# Patient Record
Sex: Female | Born: 1970 | Race: White | Hispanic: No | Marital: Married | Smoking: Never smoker
Health system: Southern US, Community
[De-identification: ages and names within clinical notes are randomized; demographics above are authoritative.]

## PROBLEM LIST (undated history)

## (undated) DIAGNOSIS — E119 Type 2 diabetes mellitus without complications: Secondary | ICD-10-CM

## (undated) DIAGNOSIS — I10 Essential (primary) hypertension: Secondary | ICD-10-CM

## (undated) DIAGNOSIS — K649 Unspecified hemorrhoids: Secondary | ICD-10-CM

## (undated) DIAGNOSIS — J301 Allergic rhinitis due to pollen: Secondary | ICD-10-CM

## (undated) HISTORY — DX: Type 2 diabetes mellitus without complications: E11.9

## (undated) HISTORY — DX: Unspecified hemorrhoids: K64.9

## (undated) HISTORY — DX: Allergic rhinitis due to pollen: J30.1

## (undated) HISTORY — DX: Essential (primary) hypertension: I10

---

## 2011-01-26 ENCOUNTER — Encounter: Payer: Self-pay | Admitting: Internal Medicine

## 2011-01-26 ENCOUNTER — Ambulatory Visit (INDEPENDENT_AMBULATORY_CARE_PROVIDER_SITE_OTHER): Payer: PRIVATE HEALTH INSURANCE | Admitting: Internal Medicine

## 2011-01-26 DIAGNOSIS — Z79899 Other long term (current) drug therapy: Secondary | ICD-10-CM

## 2011-01-26 DIAGNOSIS — I10 Essential (primary) hypertension: Secondary | ICD-10-CM

## 2011-01-26 DIAGNOSIS — Z309 Encounter for contraceptive management, unspecified: Secondary | ICD-10-CM

## 2011-01-26 DIAGNOSIS — R5381 Other malaise: Secondary | ICD-10-CM

## 2011-01-26 DIAGNOSIS — R5383 Other fatigue: Secondary | ICD-10-CM

## 2011-01-26 DIAGNOSIS — E785 Hyperlipidemia, unspecified: Secondary | ICD-10-CM

## 2011-01-26 DIAGNOSIS — E669 Obesity, unspecified: Secondary | ICD-10-CM

## 2011-01-26 DIAGNOSIS — Z124 Encounter for screening for malignant neoplasm of cervix: Secondary | ICD-10-CM

## 2011-01-26 MED ORDER — LOSARTAN POTASSIUM-HCTZ 50-12.5 MG PO TABS: 1.0000 | ORAL_TABLET | Freq: Every day | ORAL | Status: DC

## 2011-01-26 MED ORDER — NORETHINDRONE 0.35 MG PO TABS: 1.0000 | ORAL_TABLET | Freq: Every day | ORAL | Status: DC

## 2011-01-27 ENCOUNTER — Encounter: Payer: Self-pay | Admitting: Internal Medicine

## 2011-01-27 DIAGNOSIS — Z124 Encounter for screening for malignant neoplasm of cervix: Secondary | ICD-10-CM | POA: Insufficient documentation

## 2011-01-27 DIAGNOSIS — Z309 Encounter for contraceptive management, unspecified: Secondary | ICD-10-CM | POA: Insufficient documentation

## 2011-01-27 DIAGNOSIS — I10 Essential (primary) hypertension: Secondary | ICD-10-CM | POA: Insufficient documentation

## 2011-01-27 DIAGNOSIS — E669 Obesity, unspecified: Secondary | ICD-10-CM | POA: Insufficient documentation

## 2011-01-27 DIAGNOSIS — K649 Unspecified hemorrhoids: Secondary | ICD-10-CM | POA: Insufficient documentation

## 2011-01-27 NOTE — Assessment & Plan Note (Addendum)
She did not take her medication today because she had ran out but will recheck her bp at the office after she has resumed medication for one week.

## 2011-01-27 NOTE — Assessment & Plan Note (Signed)
We spent 15 minutes discussing her current weight and ways to reduce it that may also prevent her development of diabetes and hypertriglyceridemia, which apparently have been borderline in the past.

## 2011-01-27 NOTE — Progress Notes (Signed)
  Subjective:    Patient ID: Kimberly Wilson, female    DOB: 1971-01-08, 40 y.o.   MRN: [id number]  HPI  Kimberly Wilson is a 40 yo white female with a history of hypertension who presents to establish care.  Her previous care was by [facility] in [place]) and [facility]  Past Medical History  Diagnosis Date  . Hypertension   . Allergic rhinitis due to pollen   . Hemorrhoids without complication     No current outpatient prescriptions on file prior to visit.     Review of Systems  Constitutional: Negative for fever, chills and unexpected weight change.  HENT: Negative for hearing loss, ear pain, nosebleeds, congestion, sore throat, facial swelling, rhinorrhea, sneezing, mouth sores, trouble swallowing, neck pain, neck stiffness, voice change, postnasal drip, sinus pressure, tinnitus and ear discharge.   Eyes: Negative for pain, discharge, redness and visual disturbance.  Respiratory: Negative for cough, chest tightness, shortness of breath, wheezing and stridor.   Cardiovascular: Negative for chest pain, palpitations and leg swelling.  Musculoskeletal: Negative for myalgias and arthralgias.  Skin: Negative for color change and rash.  Neurological: Negative for dizziness, weakness, light-headedness and headaches.  Hematological: Negative for adenopathy.   BP 126/82  Pulse 110  Temp(Src) 98.1 F (36.7 C) (Oral)  Resp 16  Ht 5\' 2"  (1.575 m)  Wt 211 lb (95.709 kg)  BMI 38.59 kg/m2  SpO2 99%  LMP 01/04/2011     Objective:   Physical Exam  Constitutional: She is oriented to person, place, and time. She appears well-developed and well-nourished.  HENT:  Mouth/Throat: Oropharynx is clear and moist.  Eyes: EOM are normal. Pupils are equal, round, and reactive to light. No scleral icterus.  Neck: Normal range of motion. Neck supple. No JVD present. No thyromegaly present.  Cardiovascular: Normal rate, regular rhythm, normal heart sounds and intact distal  pulses.   Pulmonary/Chest: Effort normal and breath sounds normal.  Abdominal: Soft. Bowel sounds are normal. She exhibits no mass. There is no tenderness.  Musculoskeletal: Normal range of motion. She exhibits no edema.  Lymphadenopathy:    She has no cervical adenopathy.  Neurological: She is alert and oriented to person, place, and time.  Skin: Skin is warm and dry.  Psychiatric: She has a normal mood and affect.          Assessment & Plan:

## 2012-01-04 ENCOUNTER — Other Ambulatory Visit: Payer: Self-pay | Admitting: *Deleted

## 2012-01-04 MED ORDER — NORETHINDRONE 0.35 MG PO TABS: 1.0000 | ORAL_TABLET | Freq: Every day | ORAL | Status: DC

## 2012-01-22 LAB — HM PAP SMEAR: HM Pap smear: NORMAL

## 2012-01-31 ENCOUNTER — Ambulatory Visit (INDEPENDENT_AMBULATORY_CARE_PROVIDER_SITE_OTHER): Payer: PRIVATE HEALTH INSURANCE | Admitting: Internal Medicine

## 2012-01-31 ENCOUNTER — Encounter: Payer: Self-pay | Admitting: Internal Medicine

## 2012-01-31 ENCOUNTER — Other Ambulatory Visit (HOSPITAL_COMMUNITY)
Admission: RE | Admit: 2012-01-31 | Discharge: 2012-01-31 | Disposition: A | Discharge status: Home / Self Care | Payer: PRIVATE HEALTH INSURANCE | Source: Ambulatory Visit | Attending: Internal Medicine | Admitting: Internal Medicine

## 2012-01-31 VITALS — BP 118/70 | HR 97 | Temp 98.7°F | Ht 62.5 in | Wt 226.8 lb

## 2012-01-31 DIAGNOSIS — Z1151 Encounter for screening for human papillomavirus (HPV): Secondary | ICD-10-CM | POA: Insufficient documentation

## 2012-01-31 DIAGNOSIS — Z01419 Encounter for gynecological examination (general) (routine) without abnormal findings: Secondary | ICD-10-CM | POA: Insufficient documentation

## 2012-01-31 DIAGNOSIS — I10 Essential (primary) hypertension: Secondary | ICD-10-CM

## 2012-01-31 MED ORDER — NORETHINDRONE 0.35 MG PO TABS: 1.0000 | ORAL_TABLET | Freq: Every day | ORAL | Status: DC

## 2012-01-31 MED ORDER — LOSARTAN POTASSIUM-HCTZ 50-12.5 MG PO TABS: 1.0000 | ORAL_TABLET | Freq: Every day | ORAL | Status: DC

## 2012-01-31 NOTE — Progress Notes (Signed)
Patient ID: Kimberly Wilson, female   DOB: [birth year], 41 y.o.   MRN: [id number] Subjective:     Kimberly Wilson is a 41 y.o. female here for a routine exam.  Current complaints: none.  Personal health questionnaire reviewed: yes.   Gynecologic History Patient's last menstrual period was 01/14/2012. Contraception: OCP (estrogen/progesterone) Last Pap: 2012. Results were: normal Last mammogram: 2012. Results were: normal  Obstetric History OB History    Grav Para Term Preterm Abortions TAB SAB Ect Mult Living                   The following portions of the patient's history were reviewed and updated as appropriate: allergies, current medications, past family history, past medical history, past social history, past surgical history and problem list.  Review of Systems A comprehensive review of systems was negative.    Objective:    BP 118/70  Pulse 97  Temp 98.7 F (37.1 C) (Oral)  Ht 5' 2.5" (1.588 m)  Wt 226 lb 12 oz (102.853 kg)  BMI 40.81 kg/m2  SpO2 97%  LMP 01/14/2012  General Appearance:    Alert, cooperative, no distress, appears stated age  Head:    Normocephalic, without obvious abnormality, atraumatic  Eyes:    PERRL, conjunctiva/corneas clear, EOM's intact, fundi    benign, both eyes  Ears:    Normal TM's and external ear canals, both ears  Nose:   Nares normal, septum midline, mucosa normal, no drainage    or sinus tenderness  Throat:   Lips, mucosa, and tongue normal; teeth and gums normal  Neck:   Supple, symmetrical, trachea midline, no adenopathy;    thyroid:  no enlargement/tenderness/nodules; no carotid   bruit or JVD  Back:     Symmetric, no curvature, ROM normal, no CVA tenderness  Lungs:     Clear to auscultation bilaterally, respirations unlabored  Chest Wall:    No tenderness or deformity   Heart:    Regular rate and rhythm, S1 and S2 normal, no murmur, rub   or gallop  Breast Exam:    No tenderness, masses, or nipple abnormality  Abdomen:      Soft, non-tender, bowel sounds active all four quadrants,    no masses, no organomegaly  Genitalia:    Normal female without lesion, discharge or tenderness  Rectal:    Normal tone, normal prostate, no masses or tenderness;   guaiac negative stool  Extremities:   Extremities normal, atraumatic, no cyanosis or edema  Pulses:   2+ and symmetric all extremities  Skin:   Skin color, texture, turgor normal, no rashes or lesions  Lymph nodes:   Cervical, supraclavicular, and axillary nodes normal  Neurologic:   CNII-XII intact, normal strength, sensation and reflexes    throughout      Assessment:    Healthy female exam.   Hypertension Well controlled on current regimen. Renal function is due, no changes today.   Updated Medication List Outpatient Encounter Prescriptions as of 01/31/2012  Medication Sig Dispense Refill  . losartan-hydrochlorothiazide (HYZAAR) 50-12.5 MG per tablet Take 1 tablet by mouth daily.  90 tablet  3  . norethindrone (ORTHO MICRONOR) 0.35 MG tablet Take 1 tablet (0.35 mg total) by mouth daily.  3 Package  3  . DISCONTD: losartan-hydrochlorothiazide (HYZAAR) 50-12.5 MG per tablet Take 1 tablet by mouth daily.  30 tablet  11  . DISCONTD: norethindrone (ORTHO MICRONOR) 0.35 MG tablet Take 1 tablet (0.35 mg total) by mouth daily.  1 Package  0      Plan:    Education reviewed: low fat, low cholesterol diet, safe sex/STD prevention and self breast exams. Contraception: OCP (estrogen/progesterone).

## 2012-02-03 ENCOUNTER — Encounter: Payer: Self-pay | Admitting: Internal Medicine

## 2012-02-03 NOTE — Assessment & Plan Note (Signed)
Well controlled on current regimen. Renal function is due, no changes today. °

## 2012-02-05 MED ORDER — FLUCONAZOLE 150 MG PO TABS: 150.0000 mg | ORAL_TABLET | Freq: Every day | ORAL | Status: DC

## 2012-02-05 NOTE — Addendum Note (Signed)
Addended by: Sherlene Shams on: 02/05/2012 07:04 AM   Modules accepted: Orders

## 2012-12-05 ENCOUNTER — Other Ambulatory Visit: Payer: Self-pay | Admitting: *Deleted

## 2012-12-05 NOTE — Telephone Encounter (Signed)
Patient was informed needed OV for further refills on last fill please advise as to refill,

## 2012-12-24 ENCOUNTER — Telehealth: Payer: Self-pay | Admitting: Internal Medicine

## 2012-12-24 MED ORDER — NORETHINDRONE 0.35 MG PO TABS: 1.0000 | ORAL_TABLET | Freq: Every day | ORAL | Status: DC

## 2012-12-24 MED ORDER — LOSARTAN POTASSIUM-HCTZ 50-12.5 MG PO TABS: 1.0000 | ORAL_TABLET | Freq: Every day | ORAL | Status: DC

## 2012-12-24 NOTE — Telephone Encounter (Signed)
Last OV 01/31/12. Ok to refill until January?

## 2012-12-24 NOTE — Telephone Encounter (Signed)
Let message, notifying pt refills were sent to pharmacy.

## 2012-12-24 NOTE — Telephone Encounter (Signed)
Pt asking if Dr. Darrick Huntsman could possibly extend her prescriptions refills for birth control and BP medication through January.  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  Pt advised she does not have to pay in full at time of service if she is unable, but pt states she she cannot.  Please advise if it is possible to extend refills through January.

## 2012-12-24 NOTE — Telephone Encounter (Signed)
Yes,  Ok to refill the requested meds through january

## 2013-04-08 ENCOUNTER — Telehealth: Payer: Self-pay | Admitting: Internal Medicine

## 2013-04-08 MED ORDER — NORETHINDRONE 0.35 MG PO TABS: 1.0000 | ORAL_TABLET | Freq: Every day | ORAL | Status: DC

## 2013-04-08 NOTE — Telephone Encounter (Signed)
cpe scheduled 05/05/2013.  States she will run out of birth control pills and needs an extension to get her through until cpe appt.  States she will be ok with BP med until cpe.

## 2013-04-08 NOTE — Telephone Encounter (Signed)
Rx sent to pharmacy by escript  

## 2013-04-24 ENCOUNTER — Other Ambulatory Visit: Payer: Self-pay | Admitting: *Deleted

## 2013-04-27 MED ORDER — NORETHINDRONE 0.35 MG PO TABS: 1.0000 | ORAL_TABLET | Freq: Every day | ORAL | Status: DC

## 2013-04-27 NOTE — Telephone Encounter (Signed)
Appt 02/23/14 

## 2013-05-05 ENCOUNTER — Encounter: Payer: PRIVATE HEALTH INSURANCE | Admitting: Internal Medicine

## 2013-05-20 ENCOUNTER — Encounter: Payer: Self-pay | Admitting: Internal Medicine

## 2013-05-20 ENCOUNTER — Ambulatory Visit (INDEPENDENT_AMBULATORY_CARE_PROVIDER_SITE_OTHER): Payer: No Typology Code available for payment source | Admitting: Internal Medicine

## 2013-05-20 VITALS — BP 138/100 | HR 112 | Temp 99.1°F | Ht 62.0 in | Wt 237.2 lb

## 2013-05-20 DIAGNOSIS — I10 Essential (primary) hypertension: Secondary | ICD-10-CM

## 2013-05-20 DIAGNOSIS — E669 Obesity, unspecified: Secondary | ICD-10-CM

## 2013-05-20 DIAGNOSIS — Z124 Encounter for screening for malignant neoplasm of cervix: Secondary | ICD-10-CM

## 2013-05-20 DIAGNOSIS — Z1239 Encounter for other screening for malignant neoplasm of breast: Secondary | ICD-10-CM

## 2013-05-20 DIAGNOSIS — Z0000 Encounter for general adult medical examination without abnormal findings: Secondary | ICD-10-CM

## 2013-05-20 MED ORDER — NORETHINDRONE 0.35 MG PO TABS: 1.0000 | ORAL_TABLET | Freq: Every day | ORAL | Status: DC

## 2013-05-20 MED ORDER — OMEPRAZOLE 40 MG PO CPDR: 40.0000 mg | DELAYED_RELEASE_CAPSULE | Freq: Every day | ORAL | Status: AC

## 2013-05-20 NOTE — Patient Instructions (Addendum)
Increasing omeprazole to 40 mg daily in AM  .  Can add 20 mg famotine  In the evening  Increase losartan to 2 tablets in am for persistent elevation  Of 140/90  Or higher   This is  One version of a  "Low GI"  Diet:  It will allow you to lose 4 to 8  lbs  per month if you follow it carefully.  Your goal with exercise is a minimum of 30 minutes of aerobic exercise 5 days per week (Walking does not count once it becomes easy!)    All of the foods can be found at grocery stores and in bulk at [employer].  The Atkins protein bars and shakes are available in more varieties at Target, WalMart and Lowe's Foods.     7 AM Breakfast:  Choose from the following:  Low carbohydrate Protein  Shakes (I recommend the EAS AdvantEdge "Carb Control" shakes  Or the low carb shakes by Atkins.    2.5 carbs   Arnold's "Sandwhich Thin"toasted  w/ peanut butter (no jelly: about 20 net carbs  "Bagel Thin" with cream cheese and salmon: about 20 carbs   a scrambled egg/bacon/cheese burrito made with Mission's "carb balance" whole wheat tortilla  (about 10 net carbs )   Avoid cereal and bananas, oatmeal and cream of wheat and grits. They are loaded with carbohydrates!   10 AM: high protein snack  Protein bar by Atkins (the snack size, under 200 cal, usually < 6 net carbs).    A stick of cheese:  Around 1 carb,  100 cal     Dannon Light n Fit [place] Yogurt  (80 cal, 8 carbs)  Other so called "protein bars" and Greek yogurts tend to be loaded with carbohydrates.  Remember, in food advertising, the word "energy" is synonymous for " carbohydrate."  Lunch:   A Sandwich using the bread choices listed, Can use any  Eggs,  lunchmeat, grilled meat or canned tuna), avocado, regular mayo/mustard  and cheese.  A Salad using blue cheese, ranch,  Goddess or vinagrette,  No croutons or "confetti" and no "candied nuts" but regular nuts OK.   No pretzels or chips.  Pickles and miniature sweet peppers are a good low carb alternative that  provide a "crunch"  The bread is the only source of carbohydrate in a sandwich and  can be decreased by trying some of these alternatives to traditional loaf bread  Joseph's makes a pita bread and a flat bread that are 50 cal and 4 net carbs available at BJs and WalMart.  This can be toasted to use with hummous as well  Toufayan makes a low carb flatbread that's 100 cal and 9 net carbs available at [employer] and [employer] makes 2 sizes of  Low carb whole wheat tortilla  (The large one is 210 cal and 6 net carbs) Avoid "Low fat dressings, as well as Reyne Dumas and [address] dressings They are loaded with sugar!   3 PM/ Mid day  Snack:  Consider  1 ounce of  almonds, walnuts, pistachios, pecans, peanuts,  Macadamia nuts or a nut medley.  Avoid "granola"; the dried cranberries and raisins are loaded with carbohydrates. Mixed nuts as long as there are no raisins,  cranberries or dried fruit.     6 PM  Dinner:     Meat/fowl/fish with a green salad, and either broccoli, cauliflower, green beans, spinach, brussel sprouts or  Lima beans. DO NOT  BREAD THE PROTEIN!!      There is a low carb pasta by Dreamfield's that is acceptable and tastes great: only 5 digestible carbs/serving.( All grocery stores but BJs carry it )  Try Kai LevinsMichel Angelo's chicken piccata or chicken or eggplant parm over low carb pasta.(Lowes and BJs)   Clifton CustardAaron Sanchez's "Carnitas" (pulled pork, no sauce,  0 carbs) or his beef pot roast to make a dinner burrito (at BJ's)  Pesto over low carb pasta (bj's sells a good quality pesto in the center refrigerated section of the deli   Whole wheat pasta is still full of digestible carbs and  Not as low in glycemic index as Dreamfield's.   Brown rice is still rice,  So skip the rice and noodles if you eat Congohinese or New Zealandhai (or at least limit to 1/2 cup)  9 PM snack :   Breyer's "low carb" fudgsicle or  ice cream bar (Carb Smart line), or  Weight Watcher's ice cream bar , or another "no  sugar added" ice cream;  a serving of fresh berries/cherries with whipped cream   Cheese or DANNON'S LlGHT N FIT [place] YOGURT  Avoid bananas, pineapple, grapes  and watermelon on a regular basis because they are high in sugar.  THINK OF THEM AS DESSERT  Remember that snack Substitutions should be less than 10 NET carbs per serving and meals < 20 carbs. Remember to subtract fiber grams to get the "net carbs."

## 2013-05-20 NOTE — Progress Notes (Signed)
Patient ID: Kimberly Wilson, female   DOB: [birth year], 43 y.o.   MRN: [id number]    Subjective:     Kimberly Wilson is a 43 y.o. female and is here for a comprehensive physical exam. The patient reports problems - as follows:Marland Kitchen  History   Social History  . Marital Status: Married    Spouse Name: N/A    Number of Children: N/A  . Years of Education: N/A   Occupational History  . Not on file.   Social History Main Topics  . Smoking status: Never Smoker   . Smokeless tobacco: Never Used  . Alcohol Use: Yes  . Drug Use: No  . Sexual Activity: Not on file   Other Topics Concern  . Not on file   Social History Narrative  . No narrative on file   Health Maintenance  Topic Date Due  . Influenza Vaccine  05/20/2014  . Pap Smear  01/31/2015  . Tetanus/tdap  05/20/2017    The following portions of the patient's history were reviewed and updated as appropriate: allergies, current medications, past family history, past medical history, past social history, past surgical history and problem list.  Review of Systems A comprehensive review of systems was negative.   Objective:   BP 138/100  Pulse 112  Temp(Src) 99.1 F (37.3 C) (Oral)  Ht 5\' 2"  (1.575 m)  Wt 237 lb 4 oz (107.616 kg)  BMI 43.38 kg/m2  SpO2 93%  LMP 04/29/2013  General Appearance:    Alert, cooperative, no distress, appears stated age  Head:    Normocephalic, without obvious abnormality, atraumatic  Eyes:    PERRL, conjunctiva/corneas clear, EOM's intact, fundi    benign, both eyes  Ears:    Normal TM's and external ear canals, both ears  Nose:   Nares normal, septum midline, mucosa normal, no drainage    or sinus tenderness  Throat:   Lips, mucosa, and tongue normal; teeth and gums normal  Neck:   Supple, symmetrical, trachea midline, no adenopathy;    thyroid:  no enlargement/tenderness/nodules; no carotid   bruit or JVD  Back:     Symmetric, no curvature, ROM normal, no CVA tenderness  Lungs:      Clear to auscultation bilaterally, respirations unlabored  Chest Wall:    No tenderness or deformity   Heart:    Regular rate and rhythm, S1 and S2 normal, no murmur, rub   or gallop  Breast Exam:    No tenderness, masses, or nipple abnormality  Abdomen:     Soft, non-tender, bowel sounds active all four quadrants,    no masses, no organomegaly  Extremities:   Extremities normal, atraumatic, no cyanosis or edema  Pulses:   2+ and symmetric all extremities  Skin:   Skin color, texture, turgor normal, no rashes or lesions  Lymph nodes:   Cervical, supraclavicular, and axillary nodes normal  Neurologic:   CNII-XII intact, normal strength, sensation and reflexes    throughout     Assessment and Plan:   Hypertension Elevated today.  Reviewed list of meds, patient is not taking OTC meds that could be causing,. It.  Have asked patient to recheck bp at home a minimum of 5 times over the next 4 weeks and if her readings are consistently greater than 140/80 she is to increase the losartan to 2 tablets daily and call for new prescription    Obesity (BMI 30-39.9) I have addressed  BMI and recommended a low glycemic index  diet utilizing smaller more frequent meals to increase metabolism. Long discussion about diet exercise and current schedule. I have also recommended that patient start exercising with a goal of 30 minutes of aerobic exercise a minimum of 5 days per week. Screening for lipid disorders, thyroid and diabetes to be done  This week .    Contraceptive management Refill today. Pap was normal in October 2013,  she will be due in 2016.  Screening for cervical cancer She has no history of abnormal Pap smears she had normal Pap smear in October 2013. She deferred pelvic  exam today we will repeat a Pap smear next year..   Screening for breast cancer She is due for mammogram but prefers to wait until May when the county has a participation in the prescreening offered by the hospital for  employees  Encounter for preventive health examination Annual comprehensive exam was done including breast, excluding pelvic exam. . All screenings have been addressed .   A total of 45 minutes was spent with patient more than half of which was spent in counseling, reviewing records from other prviders and coordination of care. Updated Medication List Outpatient Encounter Prescriptions as of 05/20/2013  Medication Sig  . losartan-hydrochlorothiazide (HYZAAR) 50-12.5 MG per tablet Take 1 tablet by mouth daily.  . norethindrone (ORTHO MICRONOR) 0.35 MG tablet Take 1 tablet (0.35 mg total) by mouth daily.  . [DISCONTINUED] norethindrone (ORTHO MICRONOR) 0.35 MG tablet Take 1 tablet (0.35 mg total) by mouth daily.  Marland Kitchen. omeprazole (PRILOSEC) 40 MG capsule Take 1 capsule (40 mg total) by mouth daily.  . [DISCONTINUED] fluconazole (DIFLUCAN) 150 MG tablet Take 1 tablet (150 mg total) by mouth daily.

## 2013-05-20 NOTE — Assessment & Plan Note (Addendum)
Elevated today.  Reviewed list of meds, patient is not taking OTC meds that could be causing,. It.  Have asked patient to recheck bp at home a minimum of 5 times over the next 4 weeks and if her readings are consistently greater than 140/80 she is to increase the losartan to 2 tablets daily and call for new prescription

## 2013-05-20 NOTE — Progress Notes (Signed)
Pre-visit discussion using our clinic review tool. No additional management support is needed unless otherwise documented below in the visit note.  

## 2013-05-21 DIAGNOSIS — Z1239 Encounter for other screening for malignant neoplasm of breast: Secondary | ICD-10-CM | POA: Insufficient documentation

## 2013-05-21 DIAGNOSIS — Z0000 Encounter for general adult medical examination without abnormal findings: Secondary | ICD-10-CM | POA: Insufficient documentation

## 2013-05-21 NOTE — Assessment & Plan Note (Signed)
I have addressed  BMI and recommended a low glycemic index diet utilizing smaller more frequent meals to increase metabolism. Long discussion about diet exercise and current schedule. I have also recommended that patient start exercising with a goal of 30 minutes of aerobic exercise a minimum of 5 days per week. Screening for lipid disorders, thyroid and diabetes to be done  This week .

## 2013-05-21 NOTE — Assessment & Plan Note (Signed)
Annual comprehensive exam was done including breast, excluding pelvicexam. All screenings have been addressed .  

## 2013-05-21 NOTE — Assessment & Plan Note (Signed)
She is due for mammogram but prefers to wait until May when the county has a participation in the prescreening offered by the hospital for employees

## 2013-05-21 NOTE — Assessment & Plan Note (Signed)
Refill today. Pap was normal in October 2013,  she will be due in 2016.

## 2013-05-21 NOTE — Assessment & Plan Note (Signed)
She has no history of abnormal Pap smears she had normal Pap smear in October 2013. She deferred pelvic  exam today we will repeat a Pap smear next year..Marland Kitchen

## 2013-05-25 ENCOUNTER — Telehealth: Payer: Self-pay | Admitting: Internal Medicine

## 2013-05-25 NOTE — Telephone Encounter (Signed)
Relevant patient education mailed to patient.  

## 2013-05-27 ENCOUNTER — Other Ambulatory Visit: Payer: Self-pay | Admitting: *Deleted

## 2013-05-27 NOTE — Telephone Encounter (Signed)
The patient is calling in wanting to get her lab results.Informed her Darrick Huntsmanullo was out and so was her nurse-sick. Please advise.

## 2013-05-28 MED ORDER — LOSARTAN POTASSIUM-HCTZ 50-12.5 MG PO TABS: 1.0000 | ORAL_TABLET | Freq: Every day | ORAL | Status: DC

## 2013-05-29 ENCOUNTER — Telehealth: Payer: Self-pay | Admitting: Internal Medicine

## 2013-05-29 NOTE — Telephone Encounter (Signed)
Patient called back notified patient you had not seen labs patient stated she will have county fax over on Monday. FYI

## 2013-05-29 NOTE — Telephone Encounter (Signed)
I havent' seen any labs results

## 2013-05-29 NOTE — Telephone Encounter (Signed)
The patient is returning your call she will call back at 12:00.

## 2013-05-29 NOTE — Telephone Encounter (Signed)
Patient calling for lab results stated for faxed over please advise.

## 2013-05-31 ENCOUNTER — Telehealth: Payer: Self-pay | Admitting: Internal Medicine

## 2013-05-31 DIAGNOSIS — R748 Abnormal levels of other serum enzymes: Secondary | ICD-10-CM | POA: Insufficient documentation

## 2013-05-31 DIAGNOSIS — E1169 Type 2 diabetes mellitus with other specified complication: Secondary | ICD-10-CM

## 2013-05-31 DIAGNOSIS — R7301 Impaired fasting glucose: Secondary | ICD-10-CM | POA: Insufficient documentation

## 2013-05-31 DIAGNOSIS — E669 Obesity, unspecified: Secondary | ICD-10-CM

## 2013-05-31 NOTE — Telephone Encounter (Signed)
Her labs were received from her office,  Her fasting glucose was 201,  Which is now  diagnostic of diabetes.  Her cholesterol was also high (triglycerides were  248 , normal is 150) .  She also had liver enzyme elevation which could be due to fatty liver or a variety of other conditions that irritate the liver .   She needs to make an OV to discuss the diagnosis,  In the meantime can she have a Hgba1c drawn to see if medications need to be started for diabetes or just dietary changes. Can she get that done without an order?

## 2013-05-31 NOTE — Assessment & Plan Note (Signed)
I have addressed  BMI and recommended a low glycemic index diet utilizing smaller more frequent meals to increase metabolism. Long discussion about diet exercise and current schedule. I have also recommended that patient start exercising with a goal of 30 minutes of aerobic exercise a minimum of 5 days per week. Screening for lipid disorders, thyroid and diabetes to be done  This week .

## 2013-06-01 NOTE — Telephone Encounter (Signed)
Patient is on mandatory overtime at this time and stated it will be very difficult for her to get the time off for an appointment please advise if I could maybe schedule her for a late night appointment patient gets off work at 5 pm.

## 2013-06-01 NOTE — Telephone Encounter (Signed)
Patient scheduled for 5.30 pm next Monday, for DM and lab follow up. Patient requested to please not add diagnosis to her chart until she can speak with you at appointment.

## 2013-06-01 NOTE — Telephone Encounter (Signed)
Yes you can schedule a 5:00 appt,  It will need to be 30 minutes for new diagnosis diabetes.  Can she get the A1c drawn this week so we have it back ?

## 2013-06-08 ENCOUNTER — Ambulatory Visit: Payer: No Typology Code available for payment source | Admitting: Internal Medicine

## 2013-11-05 ENCOUNTER — Other Ambulatory Visit: Payer: Self-pay | Admitting: Internal Medicine

## 2014-06-11 ENCOUNTER — Ambulatory Visit: Payer: Self-pay | Admitting: Nurse Practitioner

## 2015-03-14 ENCOUNTER — Ambulatory Visit: Payer: Self-pay | Admitting: Physician Assistant

## 2015-03-14 ENCOUNTER — Other Ambulatory Visit: Payer: Self-pay

## 2015-03-14 ENCOUNTER — Encounter: Payer: Self-pay | Admitting: Physician Assistant

## 2015-03-14 VITALS — BP 150/90 | HR 97 | Temp 99.1°F

## 2015-03-14 DIAGNOSIS — E785 Hyperlipidemia, unspecified: Secondary | ICD-10-CM

## 2015-03-14 DIAGNOSIS — E119 Type 2 diabetes mellitus without complications: Secondary | ICD-10-CM

## 2015-03-14 DIAGNOSIS — H6980 Other specified disorders of Eustachian tube, unspecified ear: Secondary | ICD-10-CM

## 2015-03-14 DIAGNOSIS — J069 Acute upper respiratory infection, unspecified: Secondary | ICD-10-CM

## 2015-03-14 DIAGNOSIS — J018 Other acute sinusitis: Secondary | ICD-10-CM

## 2015-03-14 DIAGNOSIS — R5382 Chronic fatigue, unspecified: Secondary | ICD-10-CM

## 2015-03-14 MED ORDER — PREDNISONE 10 MG PO TABS: 30.0000 mg | ORAL_TABLET | Freq: Every day | ORAL | Status: DC

## 2015-03-14 MED ORDER — FLUCONAZOLE 150 MG PO TABS: 150.0000 mg | ORAL_TABLET | Freq: Once | ORAL | Status: DC

## 2015-03-14 MED ORDER — AZITHROMYCIN 250 MG PO TABS: ORAL_TABLET | ORAL | Status: DC

## 2015-03-14 NOTE — Progress Notes (Signed)
Patient came in to have blood drawn per Kimberly Wilson, ANP-C at [facility].  Blood was drawn from left hand arm without any incident.

## 2015-03-14 NOTE — Progress Notes (Signed)
S:  C/o ears popping and being stopped up, no drainage from ears, no fever/chills, no cough or congestion, some sinus pressure, some wheezing, no cp/sob;  remainder ros neg Using otc meds without relief  O:  Vitals wnl, nad, tms dull b/l, nasal mucosa swollen, throat wnl, neck supple no lymph, lungs c t a, cv rrr, neuro intact  A: acute eustachean tube dysfunction, acute uri  P: flonase, zpack, diflucan if needed;  prednisone 30mg  qd x 3d, return if not improving in 3 to 5 days, return earlier if worsening

## 2015-03-15 LAB — CMP12+LP+TP+TSH+6AC+CBC/D/PLT
A/G RATIO: 1.5 (ref 1.1–2.5)
ALBUMIN: 3.8 g/dL (ref 3.5–5.5)
ALK PHOS: 99 IU/L (ref 39–117)
ALT: 16 IU/L (ref 0–32)
AST: 18 IU/L (ref 0–40)
BASOS ABS: 0 10*3/uL (ref 0.0–0.2)
BASOS: 0 %
BUN / CREAT RATIO: 16 (ref 9–23)
BUN: 9 mg/dL (ref 6–24)
CHLORIDE: 100 mmol/L (ref 97–106)
CHOLESTEROL TOTAL: 169 mg/dL (ref 100–199)
Calcium: 8.5 mg/dL — ABNORMAL LOW (ref 8.7–10.2)
Chol/HDL Ratio: 2.7 ratio units (ref 0.0–4.4)
Creatinine, Ser: 0.57 mg/dL (ref 0.57–1.00)
EOS (ABSOLUTE): 0.1 10*3/uL (ref 0.0–0.4)
EOS: 1 %
FREE THYROXINE INDEX: 1.9 (ref 1.2–4.9)
GFR calc non Af Amer: 112 mL/min/{1.73_m2} (ref >59)
GFR, EST AFRICAN AMERICAN: 129 mL/min/{1.73_m2} (ref >59)
GGT: 43 IU/L (ref 0–60)
GLUCOSE: 164 mg/dL — AB (ref 65–99)
Globulin, Total: 2.6 g/dL (ref 1.5–4.5)
HDL: 62 mg/dL (ref >39)
HEMATOCRIT: 38.9 % (ref 34.0–46.6)
HEMOGLOBIN: 13 g/dL (ref 11.1–15.9)
IMMATURE GRANS (ABS): 0 10*3/uL (ref 0.0–0.1)
IMMATURE GRANULOCYTES: 0 %
IRON: 49 ug/dL (ref 27–159)
LDH: 163 IU/L (ref 119–226)
LDL CALC: 91 mg/dL (ref 0–99)
LYMPHS ABS: 2 10*3/uL (ref 0.7–3.1)
LYMPHS: 28 %
MCH: 29 pg (ref 26.6–33.0)
MCHC: 33.4 g/dL (ref 31.5–35.7)
MCV: 87 fL (ref 79–97)
MONOCYTES: 6 %
Monocytes Absolute: 0.4 10*3/uL (ref 0.1–0.9)
NEUTROS PCT: 65 %
Neutrophils Absolute: 4.6 10*3/uL (ref 1.4–7.0)
PLATELETS: 272 10*3/uL (ref 150–379)
Phosphorus: 3.2 mg/dL (ref 2.5–4.5)
Potassium: 4 mmol/L (ref 3.5–5.2)
RBC: 4.49 x10E6/uL (ref 3.77–5.28)
RDW: 13.6 % (ref 12.3–15.4)
SODIUM: 138 mmol/L (ref 136–144)
T3 UPTAKE RATIO: 26 % (ref 24–39)
T4, Total: 7.4 ug/dL (ref 4.5–12.0)
TOTAL PROTEIN: 6.4 g/dL (ref 6.0–8.5)
TSH: 1.94 u[IU]/mL (ref 0.450–4.500)
Triglycerides: 79 mg/dL (ref 0–149)
URIC ACID: 2.3 mg/dL — AB (ref 2.5–7.1)
VLDL CHOLESTEROL CAL: 16 mg/dL (ref 5–40)
WBC: 7.1 10*3/uL (ref 3.4–10.8)

## 2015-03-15 LAB — MICROALBUMIN / CREATININE URINE RATIO
Creatinine, Urine: 138.6 mg/dL
MICROALB/CREAT RATIO: 63.1 mg/g{creat} — AB (ref 0.0–30.0)
MICROALBUM., U, RANDOM: 87.5 ug/mL

## 2015-03-23 ENCOUNTER — Other Ambulatory Visit: Payer: Self-pay

## 2015-03-23 DIAGNOSIS — E119 Type 2 diabetes mellitus without complications: Secondary | ICD-10-CM

## 2015-03-23 NOTE — Progress Notes (Signed)
Patient came in to have blood drawn per Imelda Pillowhelsa Holland, ANP-C at [facility].  Blood was drawn from left hand without any incident.

## 2015-03-24 LAB — HGB A1C W/O EAG: Hgb A1c MFr Bld: 7.8 % — ABNORMAL HIGH (ref 4.8–5.6)

## 2015-03-24 NOTE — Progress Notes (Signed)
Lab results were faxed to Imelda Pillowhelsa Holland at [facility] per patient's request.

## 2015-03-29 ENCOUNTER — Ambulatory Visit: Payer: Self-pay | Admitting: Physician Assistant

## 2015-08-03 ENCOUNTER — Other Ambulatory Visit: Payer: Self-pay

## 2015-08-03 DIAGNOSIS — Z299 Encounter for prophylactic measures, unspecified: Secondary | ICD-10-CM

## 2015-08-03 NOTE — Progress Notes (Signed)
Patient came in to have blood drawn per Rockney Gheehelsea Holland, ANP at [facility] orders.  Blood was drawn from the right hand without any incident. Patient [facility] and she (patient) wants a copy mailed to her home address.

## 2015-08-04 LAB — CMP12+LP+TP+TSH+6AC+CBC/D/PLT
A/G RATIO: 1.6 (ref 1.2–2.2)
ALBUMIN: 3.9 g/dL (ref 3.5–5.5)
ALT: 32 IU/L (ref 0–32)
AST: 20 IU/L (ref 0–40)
Alkaline Phosphatase: 101 IU/L (ref 39–117)
BASOS ABS: 0 10*3/uL (ref 0.0–0.2)
BUN/Creatinine Ratio: 12 (ref 9–23)
BUN: 8 mg/dL (ref 6–24)
Basos: 0 %
Bilirubin Total: 0.5 mg/dL (ref 0.0–1.2)
CALCIUM: 8.9 mg/dL (ref 8.7–10.2)
CHOLESTEROL TOTAL: 168 mg/dL (ref 100–199)
Chloride: 97 mmol/L (ref 96–106)
Chol/HDL Ratio: 3.3 ratio units (ref 0.0–4.4)
Creatinine, Ser: 0.66 mg/dL (ref 0.57–1.00)
EOS (ABSOLUTE): 0.1 10*3/uL (ref 0.0–0.4)
Eos: 1 %
Estimated CHD Risk: <0.5 times avg. (ref 0.0–1.0)
FREE THYROXINE INDEX: 1.6 (ref 1.2–4.9)
GFR calc non Af Amer: 107 mL/min/{1.73_m2} (ref >59)
GFR, EST AFRICAN AMERICAN: 123 mL/min/{1.73_m2} (ref >59)
GGT: 72 IU/L — AB (ref 0–60)
GLOBULIN, TOTAL: 2.4 g/dL (ref 1.5–4.5)
Glucose: 229 mg/dL — ABNORMAL HIGH (ref 65–99)
HDL: 51 mg/dL (ref >39)
Hematocrit: 39.8 % (ref 34.0–46.6)
Hemoglobin: 13.6 g/dL (ref 11.1–15.9)
IMMATURE GRANS (ABS): 0 10*3/uL (ref 0.0–0.1)
IRON: 106 ug/dL (ref 27–159)
Immature Granulocytes: 0 %
LDH: 169 IU/L (ref 119–226)
LDL Calculated: 85 mg/dL (ref 0–99)
LYMPHS: 29 %
Lymphocytes Absolute: 1.9 10*3/uL (ref 0.7–3.1)
MCH: 29.6 pg (ref 26.6–33.0)
MCHC: 34.2 g/dL (ref 31.5–35.7)
MCV: 87 fL (ref 79–97)
MONOCYTES: 5 %
MONOS ABS: 0.4 10*3/uL (ref 0.1–0.9)
NEUTROS ABS: 4.3 10*3/uL (ref 1.4–7.0)
Neutrophils: 65 %
PHOSPHORUS: 3.6 mg/dL (ref 2.5–4.5)
PLATELETS: 231 10*3/uL (ref 150–379)
POTASSIUM: 4.2 mmol/L (ref 3.5–5.2)
RBC: 4.6 x10E6/uL (ref 3.77–5.28)
RDW: 13.7 % (ref 12.3–15.4)
Sodium: 138 mmol/L (ref 134–144)
T3 UPTAKE RATIO: 27 % (ref 24–39)
T4, Total: 6 ug/dL (ref 4.5–12.0)
TOTAL PROTEIN: 6.3 g/dL (ref 6.0–8.5)
TRIGLYCERIDES: 158 mg/dL — AB (ref 0–149)
TSH: 2.75 u[IU]/mL (ref 0.450–4.500)
Uric Acid: 2.5 mg/dL (ref 2.5–7.1)
VLDL Cholesterol Cal: 32 mg/dL (ref 5–40)
WBC: 6.7 10*3/uL (ref 3.4–10.8)

## 2015-08-04 LAB — HGB A1C W/O EAG: HEMOGLOBIN A1C: 9.7 % — AB (ref 4.8–5.6)

## 2015-11-28 ENCOUNTER — Other Ambulatory Visit: Payer: Self-pay

## 2015-11-28 DIAGNOSIS — Z299 Encounter for prophylactic measures, unspecified: Secondary | ICD-10-CM

## 2015-11-28 NOTE — Progress Notes (Signed)
Patient came in to have blood drawn for testing per Imelda Pillowhelsa Holland from [facility] authorization.

## 2015-11-29 LAB — CMP12+LP+TP+TSH+6AC+CBC/D/PLT
ALBUMIN: 4.1 g/dL (ref 3.5–5.5)
ALK PHOS: 98 IU/L (ref 39–117)
ALT: 22 IU/L (ref 0–32)
AST: 16 IU/L (ref 0–40)
Albumin/Globulin Ratio: 1.8 (ref 1.2–2.2)
BASOS: 0 %
BUN/Creatinine Ratio: 13 (ref 9–23)
BUN: 7 mg/dL (ref 6–24)
Basophils Absolute: 0 10*3/uL (ref 0.0–0.2)
Bilirubin Total: 0.3 mg/dL (ref 0.0–1.2)
CALCIUM: 8.6 mg/dL — AB (ref 8.7–10.2)
CHLORIDE: 99 mmol/L (ref 96–106)
CHOL/HDL RATIO: 3.2 ratio (ref 0.0–4.4)
CHOLESTEROL TOTAL: 161 mg/dL (ref 100–199)
Creatinine, Ser: 0.53 mg/dL — ABNORMAL LOW (ref 0.57–1.00)
EOS (ABSOLUTE): 0.1 10*3/uL (ref 0.0–0.4)
Eos: 1 %
FREE THYROXINE INDEX: 1.4 (ref 1.2–4.9)
GFR calc Af Amer: 133 mL/min/{1.73_m2} (ref >59)
GFR calc non Af Amer: 115 mL/min/{1.73_m2} (ref >59)
GGT: 88 IU/L — AB (ref 0–60)
GLOBULIN, TOTAL: 2.3 g/dL (ref 1.5–4.5)
Glucose: 179 mg/dL — ABNORMAL HIGH (ref 65–99)
HDL: 50 mg/dL (ref >39)
HEMATOCRIT: 39 % (ref 34.0–46.6)
Hemoglobin: 12.8 g/dL (ref 11.1–15.9)
Immature Grans (Abs): 0 10*3/uL (ref 0.0–0.1)
Immature Granulocytes: 0 %
Iron: 65 ug/dL (ref 27–159)
LDH: 134 IU/L (ref 119–226)
LDL CALC: 72 mg/dL (ref 0–99)
LYMPHS ABS: 2.3 10*3/uL (ref 0.7–3.1)
Lymphs: 30 %
MCH: 29 pg (ref 26.6–33.0)
MCHC: 32.8 g/dL (ref 31.5–35.7)
MCV: 88 fL (ref 79–97)
MONOS ABS: 0.4 10*3/uL (ref 0.1–0.9)
Monocytes: 5 %
NEUTROS ABS: 4.7 10*3/uL (ref 1.4–7.0)
Neutrophils: 64 %
PHOSPHORUS: 3.2 mg/dL (ref 2.5–4.5)
POTASSIUM: 4 mmol/L (ref 3.5–5.2)
Platelets: 246 10*3/uL (ref 150–379)
RBC: 4.41 x10E6/uL (ref 3.77–5.28)
RDW: 13.6 % (ref 12.3–15.4)
SODIUM: 139 mmol/L (ref 134–144)
T3 Uptake Ratio: 24 % (ref 24–39)
T4 TOTAL: 5.7 ug/dL (ref 4.5–12.0)
TSH: 1.74 u[IU]/mL (ref 0.450–4.500)
Total Protein: 6.4 g/dL (ref 6.0–8.5)
Triglycerides: 193 mg/dL — ABNORMAL HIGH (ref 0–149)
Uric Acid: 2.6 mg/dL (ref 2.5–7.1)
VLDL Cholesterol Cal: 39 mg/dL (ref 5–40)
WBC: 7.5 10*3/uL (ref 3.4–10.8)

## 2015-11-29 LAB — MICROALBUMIN / CREATININE URINE RATIO
Creatinine, Urine: 156.1 mg/dL
MICROALB/CREAT RATIO: 69.2 mg/g creat — ABNORMAL HIGH (ref 0.0–30.0)
MICROALBUM., U, RANDOM: 108 ug/mL

## 2015-11-29 LAB — HGB A1C W/O EAG: Hgb A1c MFr Bld: 8.8 % — ABNORMAL HIGH (ref 4.8–5.6)

## 2016-07-27 ENCOUNTER — Other Ambulatory Visit: Payer: Self-pay

## 2016-07-27 DIAGNOSIS — Z299 Encounter for prophylactic measures, unspecified: Secondary | ICD-10-CM

## 2016-07-27 NOTE — Progress Notes (Signed)
Patient came in to have blood drawn for testing per Orson Eva, ANP orders. Patient wants results faxed to her provider's office and a copy mailed the her home address when they are finalized.

## 2016-07-28 LAB — CMP12+LP+TP+TSH+6AC+CBC/D/PLT
A/G RATIO: 1.6 (ref 1.2–2.2)
ALT: 33 IU/L — AB (ref 0–32)
AST: 24 IU/L (ref 0–40)
Albumin: 4.2 g/dL (ref 3.5–5.5)
Alkaline Phosphatase: 118 IU/L — ABNORMAL HIGH (ref 39–117)
BASOS ABS: 0 10*3/uL (ref 0.0–0.2)
BUN / CREAT RATIO: 26 — AB (ref 9–23)
BUN: 16 mg/dL (ref 6–24)
Basos: 0 %
Bilirubin Total: 0.3 mg/dL (ref 0.0–1.2)
CALCIUM: 9.5 mg/dL (ref 8.7–10.2)
CHOL/HDL RATIO: 3.1 ratio (ref 0.0–4.4)
CREATININE: 0.61 mg/dL (ref 0.57–1.00)
Chloride: 96 mmol/L (ref 96–106)
Cholesterol, Total: 174 mg/dL (ref 100–199)
EOS (ABSOLUTE): 0.1 10*3/uL (ref 0.0–0.4)
Eos: 1 %
Estimated CHD Risk: <0.5 times avg. (ref 0.0–1.0)
Free Thyroxine Index: 1.6 (ref 1.2–4.9)
GFR, EST AFRICAN AMERICAN: 126 mL/min/{1.73_m2} (ref >59)
GFR, EST NON AFRICAN AMERICAN: 109 mL/min/{1.73_m2} (ref >59)
GGT: 125 IU/L — AB (ref 0–60)
GLOBULIN, TOTAL: 2.6 g/dL (ref 1.5–4.5)
Glucose: 237 mg/dL — ABNORMAL HIGH (ref 65–99)
HDL: 56 mg/dL (ref >39)
Hematocrit: 42.1 % (ref 34.0–46.6)
Hemoglobin: 14.2 g/dL (ref 11.1–15.9)
IMMATURE GRANS (ABS): 0 10*3/uL (ref 0.0–0.1)
IRON: 68 ug/dL (ref 27–159)
Immature Granulocytes: 0 %
LDH: 179 IU/L (ref 119–226)
LDL Calculated: 66 mg/dL (ref 0–99)
LYMPHS ABS: 2.7 10*3/uL (ref 0.7–3.1)
Lymphs: 31 %
MCH: 29.2 pg (ref 26.6–33.0)
MCHC: 33.7 g/dL (ref 31.5–35.7)
MCV: 86 fL (ref 79–97)
MONOS ABS: 0.6 10*3/uL (ref 0.1–0.9)
Monocytes: 6 %
NEUTROS ABS: 5.5 10*3/uL (ref 1.4–7.0)
Neutrophils: 62 %
PHOSPHORUS: 3.5 mg/dL (ref 2.5–4.5)
PLATELETS: 265 10*3/uL (ref 150–379)
POTASSIUM: 4.6 mmol/L (ref 3.5–5.2)
RBC: 4.87 x10E6/uL (ref 3.77–5.28)
RDW: 13.4 % (ref 12.3–15.4)
Sodium: 140 mmol/L (ref 134–144)
T3 UPTAKE RATIO: 23 % — AB (ref 24–39)
T4 TOTAL: 6.9 ug/dL (ref 4.5–12.0)
TOTAL PROTEIN: 6.8 g/dL (ref 6.0–8.5)
TSH: 2.78 u[IU]/mL (ref 0.450–4.500)
Triglycerides: 258 mg/dL — ABNORMAL HIGH (ref 0–149)
URIC ACID: 2.6 mg/dL (ref 2.5–7.1)
VLDL Cholesterol Cal: 52 mg/dL — ABNORMAL HIGH (ref 5–40)
WBC: 8.9 10*3/uL (ref 3.4–10.8)

## 2016-07-28 LAB — HGB A1C W/O EAG: Hgb A1c MFr Bld: 8.4 % — ABNORMAL HIGH (ref 4.8–5.6)

## 2016-08-16 ENCOUNTER — Encounter: Payer: Self-pay | Admitting: Physician Assistant

## 2016-08-16 ENCOUNTER — Ambulatory Visit: Payer: Self-pay | Admitting: Physician Assistant

## 2016-08-16 VITALS — BP 123/80 | HR 89 | Temp 98.2°F | Ht 62.0 in | Wt 260.0 lb

## 2016-08-16 DIAGNOSIS — Z0189 Encounter for other specified special examinations: Secondary | ICD-10-CM

## 2016-08-16 DIAGNOSIS — Z008 Encounter for other general examination: Secondary | ICD-10-CM

## 2016-08-16 DIAGNOSIS — Z0000 Encounter for general adult medical examination without abnormal findings: Secondary | ICD-10-CM

## 2016-08-16 NOTE — Progress Notes (Signed)
S: pt here for wellness physical and biometrics for insurance purposes, no complaints ros neg. PMH:   DM, HTN, has a pcp that follows her Social:  Nonsmoker, no etoh, no drugs Fam: pt states she is adopted  O: vitals wnl, nad, ENT wnl, neck supple no lymph, lungs c t a, cv rrr, abd soft nontender bs normal all 4 quads  A: wellness, biometric physical  P:  f/u with pcp

## 2016-11-23 ENCOUNTER — Other Ambulatory Visit: Payer: Self-pay

## 2016-11-23 DIAGNOSIS — Z299 Encounter for prophylactic measures, unspecified: Secondary | ICD-10-CM

## 2016-11-23 NOTE — Progress Notes (Signed)
Patient came in to have her blood drawn for testing per Orson Evahelsa Boswell, NP orders.

## 2016-11-24 LAB — CMP12+LP+TP+TSH+6AC+CBC/D/PLT
A/G RATIO: 1.7 (ref 1.2–2.2)
ALK PHOS: 115 IU/L (ref 39–117)
ALT: 24 IU/L (ref 0–32)
AST: 18 IU/L (ref 0–40)
Albumin: 4.2 g/dL (ref 3.5–5.5)
BASOS ABS: 0 10*3/uL (ref 0.0–0.2)
BASOS: 0 %
BUN / CREAT RATIO: 14 (ref 9–23)
BUN: 9 mg/dL (ref 6–24)
Bilirubin Total: 0.3 mg/dL (ref 0.0–1.2)
CHOL/HDL RATIO: 2.8 ratio (ref 0.0–4.4)
CREATININE: 0.63 mg/dL (ref 0.57–1.00)
Calcium: 9.3 mg/dL (ref 8.7–10.2)
Chloride: 101 mmol/L (ref 96–106)
Cholesterol, Total: 174 mg/dL (ref 100–199)
EOS (ABSOLUTE): 0.1 10*3/uL (ref 0.0–0.4)
EOS: 1 %
Free Thyroxine Index: 1.5 (ref 1.2–4.9)
GFR, EST AFRICAN AMERICAN: 124 mL/min/{1.73_m2} (ref >59)
GFR, EST NON AFRICAN AMERICAN: 108 mL/min/{1.73_m2} (ref >59)
GGT: 102 IU/L — ABNORMAL HIGH (ref 0–60)
Globulin, Total: 2.5 g/dL (ref 1.5–4.5)
Glucose: 137 mg/dL — ABNORMAL HIGH (ref 65–99)
HDL: 62 mg/dL (ref >39)
HEMATOCRIT: 40.6 % (ref 34.0–46.6)
HEMOGLOBIN: 13.4 g/dL (ref 11.1–15.9)
IMMATURE GRANULOCYTES: 0 %
Immature Grans (Abs): 0 10*3/uL (ref 0.0–0.1)
Iron: 67 ug/dL (ref 27–159)
LDH: 167 IU/L (ref 119–226)
LDL CALC: 83 mg/dL (ref 0–99)
LYMPHS ABS: 2.5 10*3/uL (ref 0.7–3.1)
Lymphs: 29 %
MCH: 29.3 pg (ref 26.6–33.0)
MCHC: 33 g/dL (ref 31.5–35.7)
MCV: 89 fL (ref 79–97)
MONOCYTES: 5 %
Monocytes Absolute: 0.4 10*3/uL (ref 0.1–0.9)
Neutrophils Absolute: 5.4 10*3/uL (ref 1.4–7.0)
Neutrophils: 65 %
PLATELETS: 240 10*3/uL (ref 150–379)
Phosphorus: 3.6 mg/dL (ref 2.5–4.5)
Potassium: 4.6 mmol/L (ref 3.5–5.2)
RBC: 4.57 x10E6/uL (ref 3.77–5.28)
RDW: 13.3 % (ref 12.3–15.4)
Sodium: 146 mmol/L — ABNORMAL HIGH (ref 134–144)
T3 Uptake Ratio: 23 % — ABNORMAL LOW (ref 24–39)
T4, Total: 6.4 ug/dL (ref 4.5–12.0)
TSH: 2.85 u[IU]/mL (ref 0.450–4.500)
Total Protein: 6.7 g/dL (ref 6.0–8.5)
Triglycerides: 143 mg/dL (ref 0–149)
Uric Acid: 2.8 mg/dL (ref 2.5–7.1)
VLDL CHOLESTEROL CAL: 29 mg/dL (ref 5–40)
WBC: 8.4 10*3/uL (ref 3.4–10.8)

## 2016-11-24 LAB — HGB A1C W/O EAG: HEMOGLOBIN A1C: 7.7 % — AB (ref 4.8–5.6)

## 2016-11-24 LAB — MICROALBUMIN / CREATININE URINE RATIO
CREATININE, UR: 69.1 mg/dL
MICROALBUM., U, RANDOM: 12 ug/mL
Microalb/Creat Ratio: 17.4 mg/g creat (ref 0.0–30.0)

## 2017-03-29 ENCOUNTER — Other Ambulatory Visit: Payer: Self-pay

## 2017-03-29 DIAGNOSIS — Z299 Encounter for prophylactic measures, unspecified: Secondary | ICD-10-CM

## 2017-03-29 NOTE — Progress Notes (Signed)
Patient came in to have blood drawn [facility].

## 2017-03-30 LAB — CMP12+LP+TP+TSH+6AC+CBC/D/PLT
ALK PHOS: 111 IU/L (ref 39–117)
ALT: 33 IU/L — AB (ref 0–32)
AST: 20 IU/L (ref 0–40)
Albumin/Globulin Ratio: 1.6 (ref 1.2–2.2)
Albumin: 3.8 g/dL (ref 3.5–5.5)
BASOS: 0 %
BUN / CREAT RATIO: 14 (ref 9–23)
BUN: 9 mg/dL (ref 6–24)
Basophils Absolute: 0 10*3/uL (ref 0.0–0.2)
Bilirubin Total: 0.3 mg/dL (ref 0.0–1.2)
CALCIUM: 9.4 mg/dL (ref 8.7–10.2)
CHLORIDE: 101 mmol/L (ref 96–106)
CHOL/HDL RATIO: 2.9 ratio (ref 0.0–4.4)
Cholesterol, Total: 158 mg/dL (ref 100–199)
Creatinine, Ser: 0.64 mg/dL (ref 0.57–1.00)
EOS (ABSOLUTE): 0.1 10*3/uL (ref 0.0–0.4)
EOS: 1 %
Estimated CHD Risk: <0.5 times avg. (ref 0.0–1.0)
Free Thyroxine Index: 1.5 (ref 1.2–4.9)
GFR calc non Af Amer: 107 mL/min/{1.73_m2} (ref >59)
GFR, EST AFRICAN AMERICAN: 123 mL/min/{1.73_m2} (ref >59)
GGT: 156 IU/L — ABNORMAL HIGH (ref 0–60)
GLOBULIN, TOTAL: 2.4 g/dL (ref 1.5–4.5)
Glucose: 228 mg/dL — ABNORMAL HIGH (ref 65–99)
HDL: 55 mg/dL (ref >39)
HEMATOCRIT: 39.3 % (ref 34.0–46.6)
HEMOGLOBIN: 13.3 g/dL (ref 11.1–15.9)
IMMATURE GRANS (ABS): 0 10*3/uL (ref 0.0–0.1)
IMMATURE GRANULOCYTES: 0 %
Iron: 75 ug/dL (ref 27–159)
LDH: 153 IU/L (ref 119–226)
LDL CALC: 75 mg/dL (ref 0–99)
LYMPHS: 27 %
Lymphocytes Absolute: 2.3 10*3/uL (ref 0.7–3.1)
MCH: 29.6 pg (ref 26.6–33.0)
MCHC: 33.8 g/dL (ref 31.5–35.7)
MCV: 88 fL (ref 79–97)
MONOS ABS: 0.5 10*3/uL (ref 0.1–0.9)
Monocytes: 6 %
NEUTROS PCT: 66 %
Neutrophils Absolute: 5.5 10*3/uL (ref 1.4–7.0)
POTASSIUM: 4.4 mmol/L (ref 3.5–5.2)
Phosphorus: 4.6 mg/dL — ABNORMAL HIGH (ref 2.5–4.5)
Platelets: 228 10*3/uL (ref 150–379)
RBC: 4.49 x10E6/uL (ref 3.77–5.28)
RDW: 13.7 % (ref 12.3–15.4)
SODIUM: 141 mmol/L (ref 134–144)
T3 Uptake Ratio: 25 % (ref 24–39)
T4, Total: 5.9 ug/dL (ref 4.5–12.0)
TRIGLYCERIDES: 138 mg/dL (ref 0–149)
TSH: 2.37 u[IU]/mL (ref 0.450–4.500)
Total Protein: 6.2 g/dL (ref 6.0–8.5)
Uric Acid: 2.9 mg/dL (ref 2.5–7.1)
VLDL Cholesterol Cal: 28 mg/dL (ref 5–40)
WBC: 8.4 10*3/uL (ref 3.4–10.8)

## 2017-03-30 LAB — HGB A1C W/O EAG: HEMOGLOBIN A1C: 9.2 % — AB (ref 4.8–5.6)

## 2017-03-30 LAB — VITAMIN D 25 HYDROXY (VIT D DEFICIENCY, FRACTURES): VIT D 25 HYDROXY: 23 ng/mL — AB (ref 30.0–100.0)

## 2017-04-01 ENCOUNTER — Other Ambulatory Visit: Payer: Self-pay

## 2017-08-30 ENCOUNTER — Other Ambulatory Visit: Payer: Self-pay

## 2017-08-30 DIAGNOSIS — E559 Vitamin D deficiency, unspecified: Secondary | ICD-10-CM

## 2017-08-30 DIAGNOSIS — E785 Hyperlipidemia, unspecified: Secondary | ICD-10-CM

## 2017-08-30 DIAGNOSIS — E118 Type 2 diabetes mellitus with unspecified complications: Secondary | ICD-10-CM

## 2017-08-30 DIAGNOSIS — I10 Essential (primary) hypertension: Secondary | ICD-10-CM

## 2017-08-31 LAB — HGB A1C W/O EAG: Hgb A1c MFr Bld: 9.5 % — ABNORMAL HIGH (ref 4.8–5.6)

## 2017-08-31 LAB — COMPREHENSIVE METABOLIC PANEL
ALBUMIN: 4 g/dL (ref 3.5–5.5)
ALT: 20 IU/L (ref 0–32)
AST: 23 IU/L (ref 0–40)
Albumin/Globulin Ratio: 1.8 (ref 1.2–2.2)
Alkaline Phosphatase: 102 IU/L (ref 39–117)
BUN / CREAT RATIO: 18 (ref 9–23)
BUN: 9 mg/dL (ref 6–24)
Bilirubin Total: 0.3 mg/dL (ref 0.0–1.2)
CO2: 21 mmol/L (ref 20–29)
CREATININE: 0.49 mg/dL — AB (ref 0.57–1.00)
Calcium: 8.5 mg/dL — ABNORMAL LOW (ref 8.7–10.2)
Chloride: 102 mmol/L (ref 96–106)
GFR calc Af Amer: 134 mL/min/{1.73_m2} (ref >59)
GFR calc non Af Amer: 116 mL/min/{1.73_m2} (ref >59)
GLUCOSE: 209 mg/dL — AB (ref 65–99)
Globulin, Total: 2.2 g/dL (ref 1.5–4.5)
Potassium: 4.4 mmol/L (ref 3.5–5.2)
Sodium: 140 mmol/L (ref 134–144)
Total Protein: 6.2 g/dL (ref 6.0–8.5)

## 2017-08-31 LAB — CBC WITH DIFFERENTIAL/PLATELET
BASOS ABS: 0 10*3/uL (ref 0.0–0.2)
Basos: 0 %
EOS (ABSOLUTE): 0.1 10*3/uL (ref 0.0–0.4)
Eos: 1 %
HEMOGLOBIN: 14 g/dL (ref 11.1–15.9)
Hematocrit: 42.5 % (ref 34.0–46.6)
IMMATURE GRANS (ABS): 0 10*3/uL (ref 0.0–0.1)
IMMATURE GRANULOCYTES: 0 %
LYMPHS ABS: 2.6 10*3/uL (ref 0.7–3.1)
LYMPHS: 32 %
MCH: 29.7 pg (ref 26.6–33.0)
MCHC: 32.9 g/dL (ref 31.5–35.7)
MCV: 90 fL (ref 79–97)
Monocytes Absolute: 0.4 10*3/uL (ref 0.1–0.9)
Monocytes: 5 %
NEUTROS PCT: 62 %
Neutrophils Absolute: 5.1 10*3/uL (ref 1.4–7.0)
Platelets: 248 10*3/uL (ref 150–379)
RBC: 4.72 x10E6/uL (ref 3.77–5.28)
RDW: 13.8 % (ref 12.3–15.4)
WBC: 8.2 10*3/uL (ref 3.4–10.8)

## 2017-08-31 LAB — LIPID PANEL
Chol/HDL Ratio: 3.8 ratio (ref 0.0–4.4)
Cholesterol, Total: 180 mg/dL (ref 100–199)
HDL: 48 mg/dL (ref >39)
LDL Calculated: 78 mg/dL (ref 0–99)
TRIGLYCERIDES: 268 mg/dL — AB (ref 0–149)
VLDL Cholesterol Cal: 54 mg/dL — ABNORMAL HIGH (ref 5–40)

## 2017-08-31 LAB — TSH: TSH: 2.13 u[IU]/mL (ref 0.450–4.500)

## 2017-08-31 LAB — VITAMIN D 25 HYDROXY (VIT D DEFICIENCY, FRACTURES): Vit D, 25-Hydroxy: 34.1 ng/mL (ref 30.0–100.0)

## 2018-04-07 ENCOUNTER — Other Ambulatory Visit: Payer: Self-pay

## 2018-04-07 DIAGNOSIS — E559 Vitamin D deficiency, unspecified: Secondary | ICD-10-CM

## 2018-04-07 DIAGNOSIS — E669 Obesity, unspecified: Secondary | ICD-10-CM

## 2018-04-07 DIAGNOSIS — E785 Hyperlipidemia, unspecified: Secondary | ICD-10-CM

## 2018-04-07 DIAGNOSIS — I10 Essential (primary) hypertension: Secondary | ICD-10-CM

## 2018-04-07 DIAGNOSIS — E11649 Type 2 diabetes mellitus with hypoglycemia without coma: Secondary | ICD-10-CM

## 2018-04-08 LAB — LIPID PANEL
CHOLESTEROL TOTAL: 295 mg/dL — AB (ref 100–199)
Chol/HDL Ratio: 8.4 ratio — ABNORMAL HIGH (ref 0.0–4.4)
HDL: 35 mg/dL — ABNORMAL LOW (ref >39)
Triglycerides: 985 mg/dL (ref 0–149)

## 2018-04-08 LAB — COMPREHENSIVE METABOLIC PANEL
ALBUMIN: 4.1 g/dL (ref 3.5–5.5)
ALK PHOS: 119 IU/L — AB (ref 39–117)
ALT: 25 IU/L (ref 0–32)
AST: 16 IU/L (ref 0–40)
Albumin/Globulin Ratio: 1.7 (ref 1.2–2.2)
BUN / CREAT RATIO: 20 (ref 9–23)
BUN: 12 mg/dL (ref 6–24)
Bilirubin Total: 0.3 mg/dL (ref 0.0–1.2)
CHLORIDE: 95 mmol/L — AB (ref 96–106)
CO2: 18 mmol/L — ABNORMAL LOW (ref 20–29)
Calcium: 9.1 mg/dL (ref 8.7–10.2)
Creatinine, Ser: 0.61 mg/dL (ref 0.57–1.00)
GFR calc Af Amer: 124 mL/min/{1.73_m2} (ref >59)
GFR calc non Af Amer: 108 mL/min/{1.73_m2} (ref >59)
GLUCOSE: 357 mg/dL — AB (ref 65–99)
Globulin, Total: 2.4 g/dL (ref 1.5–4.5)
Potassium: 4 mmol/L (ref 3.5–5.2)
Sodium: 133 mmol/L — ABNORMAL LOW (ref 134–144)
Total Protein: 6.5 g/dL (ref 6.0–8.5)

## 2018-04-08 LAB — CBC WITH DIFFERENTIAL/PLATELET
BASOS: 1 %
Basophils Absolute: 0 10*3/uL (ref 0.0–0.2)
EOS (ABSOLUTE): 0.1 10*3/uL (ref 0.0–0.4)
EOS: 1 %
HEMATOCRIT: 42.8 % (ref 34.0–46.6)
Hemoglobin: 14.8 g/dL (ref 11.1–15.9)
IMMATURE GRANS (ABS): 0 10*3/uL (ref 0.0–0.1)
IMMATURE GRANULOCYTES: 0 %
LYMPHS: 28 %
Lymphocytes Absolute: 2.1 10*3/uL (ref 0.7–3.1)
MCH: 30.5 pg (ref 26.6–33.0)
MCHC: 34.6 g/dL (ref 31.5–35.7)
MCV: 88 fL (ref 79–97)
MONOCYTES: 5 %
MONOS ABS: 0.4 10*3/uL (ref 0.1–0.9)
NEUTROS PCT: 65 %
Neutrophils Absolute: 4.9 10*3/uL (ref 1.4–7.0)
Platelets: 270 10*3/uL (ref 150–450)
RBC: 4.85 x10E6/uL (ref 3.77–5.28)
RDW: 12.8 % (ref 12.3–15.4)
WBC: 7.5 10*3/uL (ref 3.4–10.8)

## 2018-04-08 LAB — HGB A1C W/O EAG: Hgb A1c MFr Bld: 11.3 % — ABNORMAL HIGH (ref 4.8–5.6)

## 2018-04-08 LAB — VITAMIN D 25 HYDROXY (VIT D DEFICIENCY, FRACTURES): VIT D 25 HYDROXY: 13.2 ng/mL — AB (ref 30.0–100.0)

## 2018-04-08 LAB — TSH: TSH: 2.25 u[IU]/mL (ref 0.450–4.500)

## 2018-04-08 NOTE — Progress Notes (Signed)
Lab Results from 04/07/18 have been routed to the Ordering Provider Orson EvaChelsa Boswell NP 04/08/18 Erskine Squibb-Lazer Wollard CMA

## 2018-05-26 ENCOUNTER — Other Ambulatory Visit: Payer: Self-pay

## 2018-05-26 DIAGNOSIS — E1165 Type 2 diabetes mellitus with hyperglycemia: Secondary | ICD-10-CM

## 2018-05-27 LAB — HGB A1C W/O EAG: Hgb A1c MFr Bld: 11.4 % — ABNORMAL HIGH (ref 4.8–5.6)

## 2018-05-27 LAB — COMPREHENSIVE METABOLIC PANEL
A/G RATIO: 2 (ref 1.2–2.2)
ALT: 21 IU/L (ref 0–32)
AST: 16 IU/L (ref 0–40)
Albumin: 4.5 g/dL (ref 3.8–4.8)
Alkaline Phosphatase: 103 IU/L (ref 39–117)
BILIRUBIN TOTAL: 0.5 mg/dL (ref 0.0–1.2)
BUN/Creatinine Ratio: 14 (ref 9–23)
BUN: 11 mg/dL (ref 6–24)
CALCIUM: 9.5 mg/dL (ref 8.7–10.2)
CHLORIDE: 96 mmol/L (ref 96–106)
CO2: 21 mmol/L (ref 20–29)
Creatinine, Ser: 0.77 mg/dL (ref 0.57–1.00)
GFR calc Af Amer: 106 mL/min/{1.73_m2} (ref >59)
GFR, EST NON AFRICAN AMERICAN: 92 mL/min/{1.73_m2} (ref >59)
GLOBULIN, TOTAL: 2.3 g/dL (ref 1.5–4.5)
Glucose: 390 mg/dL — ABNORMAL HIGH (ref 65–99)
POTASSIUM: 3.6 mmol/L (ref 3.5–5.2)
SODIUM: 137 mmol/L (ref 134–144)
Total Protein: 6.8 g/dL (ref 6.0–8.5)

## 2018-05-27 LAB — MICROALBUMIN / CREATININE URINE RATIO
Creatinine, Urine: 37.6 mg/dL
Microalb/Creat Ratio: 37 mg/g creat — ABNORMAL HIGH (ref 0–29)
Microalbumin, Urine: 14 ug/mL

## 2018-05-28 NOTE — Progress Notes (Signed)
Lab Results from 05/26/2018 have been routed to the Ordering Provider -Bambie Pizzolato CMA  

## 2018-10-15 ENCOUNTER — Other Ambulatory Visit: Payer: Self-pay

## 2018-10-15 ENCOUNTER — Telehealth: Payer: Self-pay | Admitting: *Deleted

## 2018-10-15 DIAGNOSIS — Z20822 Contact with and (suspected) exposure to covid-19: Secondary | ICD-10-CM

## 2018-10-15 NOTE — Telephone Encounter (Signed)
Contacted by Wynelle Cleveland, RN from the [facility] Department called; she requested that the pt be tested for COVID due to symptoms; pts DOB, address, and contact number [phone] verified; will attempt to contact pt.

## 2018-10-15 NOTE — Telephone Encounter (Signed)
Contacted pt and her husband to schedule testing; pt's husband accepted appointment at [facility] site 10/15/2018 at [place]; pt's husband given address, location, and instructions that he and all occupants of his vehicle should wear masks; he verbalized understanding; orders placed per protocol.

## 2018-10-20 LAB — NOVEL CORONAVIRUS, NAA: SARS-CoV-2, NAA: NOT DETECTED

## 2018-11-26 ENCOUNTER — Other Ambulatory Visit: Payer: Self-pay

## 2018-11-26 ENCOUNTER — Ambulatory Visit: Payer: Managed Care, Other (non HMO) | Admitting: Adult Health

## 2018-11-26 ENCOUNTER — Encounter: Payer: Self-pay | Admitting: Adult Health

## 2018-11-26 ENCOUNTER — Other Ambulatory Visit: Payer: Managed Care, Other (non HMO)

## 2018-11-26 VITALS — BP 130/82 | HR 100 | Temp 98.1°F | Resp 18 | Ht 62.0 in | Wt 256.0 lb

## 2018-11-26 DIAGNOSIS — E785 Hyperlipidemia, unspecified: Secondary | ICD-10-CM

## 2018-11-26 DIAGNOSIS — Z008 Encounter for other general examination: Secondary | ICD-10-CM | POA: Diagnosis not present

## 2018-11-26 DIAGNOSIS — E1165 Type 2 diabetes mellitus with hyperglycemia: Secondary | ICD-10-CM | POA: Diagnosis not present

## 2018-11-26 DIAGNOSIS — E118 Type 2 diabetes mellitus with unspecified complications: Secondary | ICD-10-CM | POA: Diagnosis not present

## 2018-11-26 DIAGNOSIS — I10 Essential (primary) hypertension: Secondary | ICD-10-CM | POA: Diagnosis not present

## 2018-11-26 DIAGNOSIS — IMO0002 Reserved for concepts with insufficient information to code with codable children: Secondary | ICD-10-CM

## 2018-11-26 DIAGNOSIS — Z0189 Encounter for other specified special examinations: Secondary | ICD-10-CM

## 2018-11-26 NOTE — Progress Notes (Signed)
[facility]  Kimberly Wilson DOB: 48 y.o. MRN: [id number]  Subjective:  Here for Biometric Screen/brief exam Patient is a 48 year old female in no acute distress who comes to the clinic for a brief biometric exam and screening for insurance purposes with [place].  She reports she sees her primary care provider and specialists as recommended.  She denies any concerns at this visit.   has Hypertension; Hemorrhoids without complication; Screening for cervical cancer; Obesity (BMI 30-39.9); Contraceptive management; Screening for breast cancer; Encounter for preventive health examination; Impaired fasting glucose; and Elevated liver enzymes on their problem list. Patient  denies any fever, body aches,chills, rash, chest pain, shortness of breath, nausea, vomiting, or diarrhea.   Objective: NAD, well developed, well nourished, obese.  HEENT: Within normal limits Neck: Normal, supple, no adenopathy  Heart: Regular rate and rhythm Lungs: Clear to auscultation with no adventitious lung sounds noted.   Assessment: Biometric screen 1. Encounter for other general examination- brief biometric exam and screening not a annual phyical    2. Encounter for biometric screening      Plan:  Follow up with primary care as needed for chronic and maintenance health care- can be seen in this employee clinic for acute care.   Fasting glucose and lipids. Discussed with patient that today's visit here is a limited biometric screening visit (not a comprehensive exam or management of any chronic problems) Discussed some health issues, including healthy eating habits and exercise. Encouraged to follow-up with PCP for annual comprehensive preventive and wellness care (and if applicable, any chronic issues). Questions invited and answered.   I will have the office call you on your glucose and cholesterol results when they return if you have not heard within  1 week please call the office.  This biometric physical is a brief physical and the only labs done are glucose and your lipid panel(cholesterol) and is  not a substitute for seeing a primary care provider for a complete annual physical. Please see a primary care physician for routine health maintenance, labs and full physical at least yearly and follow up as recommended by your provider. Provider also recommends if you do not have a primary care provider for patient to establish care as soon as possible .Patient may chose provider of choice. Also gave the [facility] at [phone] or web site at [email or web address] to help assist with finding a primary care doctor.  Patient verbalizes understanding that his office is acute care only and not a substitute for a primary care or for the management of chronic conditions.

## 2018-11-26 NOTE — Patient Instructions (Signed)
The Biometric exam is a brief physical with labs including glucose and cholesterol. This does not replace a full physical with a primary care provider, and additional recommended labs. This is an acute care clinic not for maintenance of chronic or long standing conditions.  ° °Provider also recommends if you do not have a primary care provider for patient to establish care promptly.You can choose any provider of your choice at any facility of your choice, the below information is  just a resource to aid in you finding a primary care provider for routine health maintenance.  ° °[facility]  PHYSICIAN/PROVIDER  REFERRAL LINE at 1-800-449- [phone]  °[email or web address] to help assist with finding a primary care doctor.  ° °Helpful resources below of other primary care office's accepting new patients.  ° °• [facility]  °       [address]  °[place] [address] °[phone] ° °• [facility]  °  [address] °[place], [place] [address] °[phone] ° °• [facility] °[address]  °[place], [place]  °[phone]  ° °• [facility]  °[address], [address] ° [place] [address] °[phone]  ° ° °Follow up with primary care as needed for chronic and maintenance health care- can be seen in this employee clinic for acute care.   ° °Health Maintenance, Female °Adopting a healthy lifestyle and getting preventive care are important in promoting health and wellness. Ask your health care provider about: °· The right schedule for you to have regular tests and exams. °· Things you can do on your own to prevent diseases and keep yourself healthy. °What should I know about diet, weight, and exercise? °Eat a healthy diet ° °· Eat a diet that includes plenty of vegetables, fruits, low-fat dairy products, and lean protein. °· Do not eat a lot of foods that are high in solid fats, added sugars, or  sodium. °Maintain a healthy weight °Body mass index (BMI) is used to identify weight problems. It estimates body fat based on height and weight. Your health care provider can help determine your BMI and help you achieve or maintain a healthy weight. °Get regular exercise °Get regular exercise. This is one of the most important things you can do for your health. Most adults should: °· Exercise for at least 150 minutes each week. The exercise should increase your heart rate and make you sweat (moderate-intensity exercise). °· Do strengthening exercises at least twice a week. This is in addition to the moderate-intensity exercise. °· Spend less time sitting. Even light physical activity can be beneficial. °Watch cholesterol and blood lipids °Have your blood tested for lipids and cholesterol at 49 years of age, then have this test every 5 years. °Have your cholesterol levels checked more often if: °· Your lipid or cholesterol levels are high. °· You are older than 49 years of age. °· You are at high risk for heart disease. °What should I know about cancer screening? °Depending on your health history and family history, you may need to have cancer screening at various ages. This may include screening for: °· Breast cancer. °· Cervical cancer. °· Colorectal cancer. °· Skin cancer. °· Lung cancer. °What should I know about heart disease, diabetes, and high blood pressure? °Blood pressure and heart disease °· High blood pressure causes heart disease and increases the risk of stroke. This is more likely to develop in people   who have high blood pressure readings, are of African descent, or are overweight. °· Have your blood pressure checked: °? Every 3-5 years if you are 18-39 years of age. °? Every year if you are 40 years old or older. °Diabetes °Have regular diabetes screenings. This checks your fasting blood sugar level. Have the screening done: °· Once every three years after age 40 if you are at a normal weight and have  a low risk for diabetes. °· More often and at a younger age if you are overweight or have a high risk for diabetes. °What should I know about preventing infection? °Hepatitis B °If you have a higher risk for hepatitis B, you should be screened for this virus. Talk with your health care provider to find out if you are at risk for hepatitis B infection. °Hepatitis C °Testing is recommended for: °· Everyone born from 1945 through 1965. °· Anyone with known risk factors for hepatitis C. °Sexually transmitted infections (STIs) °· Get screened for STIs, including gonorrhea and chlamydia, if: °? You are sexually active and are younger than 49 years of age. °? You are older than 49 years of age and your health care provider tells you that you are at risk for this type of infection. °? Your sexual activity has changed since you were last screened, and you are at increased risk for chlamydia or gonorrhea. Ask your health care provider if you are at risk. °· Ask your health care provider about whether you are at high risk for HIV. Your health care provider may recommend a prescription medicine to help prevent HIV infection. If you choose to take medicine to prevent HIV, you should first get tested for HIV. You should then be tested every 3 months for as long as you are taking the medicine. °Pregnancy °· If you are about to stop having your period (premenopausal) and you may become pregnant, seek counseling before you get pregnant. °· Take 400 to 800 micrograms (mcg) of folic acid every day if you become pregnant. °· Ask for birth control (contraception) if you want to prevent pregnancy. °Osteoporosis and menopause °Osteoporosis is a disease in which the bones lose minerals and strength with aging. This can result in bone fractures. If you are 65 years old or older, or if you are at risk for osteoporosis and fractures, ask your health care provider if you should: °· Be screened for bone loss. °· Take a calcium or vitamin D  supplement to lower your risk of fractures. °· Be given hormone replacement therapy (HRT) to treat symptoms of menopause. °Follow these instructions at home: °Lifestyle °· Do not use any products that contain nicotine or tobacco, such as cigarettes, e-cigarettes, and chewing tobacco. If you need help quitting, ask your health care provider. °· Do not use street drugs. °· Do not share needles. °· Ask your health care provider for help if you need support or information about quitting drugs. °Alcohol use °· Do not drink alcohol if: °? Your health care provider tells you not to drink. °? You are pregnant, may be pregnant, or are planning to become pregnant. °· If you drink alcohol: °? Limit how much you use to 0-1 drink a day. °? Limit intake if you are breastfeeding. °· Be aware of how much alcohol is in your drink. In the [place] one drink equals one 12 oz bottle of beer (355 mL), one 5 oz glass of wine (148 mL), or one 1½ oz glass of hard liquor (  44 mL). °General instructions °· Schedule regular health, dental, and eye exams. °· Stay current with your vaccines. °· Tell your health care provider if: °? You often feel depressed. °? You have ever been abused or do not feel safe at home. °Summary °· Adopting a healthy lifestyle and getting preventive care are important in promoting health and wellness. °· Follow your health care provider's instructions about healthy diet, exercising, and getting tested or screened for diseases. °· Follow your health care provider's instructions on monitoring your cholesterol and blood pressure. °This information is not intended to replace advice given to you by your health care provider. Make sure you discuss any questions you have with your health care provider. °Document Released: 10/23/2010 Document Revised: 04/02/2018 Document Reviewed: 04/02/2018 °Elsevier Patient Education © 2020 Elsevier Inc. ° °

## 2018-11-26 NOTE — Progress Notes (Signed)
Lab draw with orders only. Results will be sent to the ordering provider.   Would like orders faxed to her PCP and her Endocrinologist Dr.Melissa Solum.

## 2018-11-27 LAB — LIPID PANEL WITH LDL/HDL RATIO
Cholesterol, Total: 182 mg/dL (ref 100–199)
HDL: 61 mg/dL (ref >39)
LDL Calculated: 90 mg/dL (ref 0–99)
LDl/HDL Ratio: 1.5 ratio (ref 0.0–3.2)
Triglycerides: 153 mg/dL — ABNORMAL HIGH (ref 0–149)
VLDL Cholesterol Cal: 31 mg/dL (ref 5–40)

## 2018-11-27 LAB — COMPREHENSIVE METABOLIC PANEL
ALT: 21 IU/L (ref 0–32)
AST: 16 IU/L (ref 0–40)
Albumin/Globulin Ratio: 1.6 (ref 1.2–2.2)
Albumin: 3.9 g/dL (ref 3.8–4.8)
Alkaline Phosphatase: 115 IU/L (ref 39–117)
BUN/Creatinine Ratio: 20 (ref 9–23)
BUN: 10 mg/dL (ref 6–24)
Bilirubin Total: 0.3 mg/dL (ref 0.0–1.2)
CO2: 22 mmol/L (ref 20–29)
Calcium: 8.9 mg/dL (ref 8.7–10.2)
Chloride: 100 mmol/L (ref 96–106)
Creatinine, Ser: 0.51 mg/dL — ABNORMAL LOW (ref 0.57–1.00)
GFR calc Af Amer: 131 mL/min/{1.73_m2} (ref >59)
GFR calc non Af Amer: 114 mL/min/{1.73_m2} (ref >59)
Globulin, Total: 2.4 g/dL (ref 1.5–4.5)
Glucose: 147 mg/dL — ABNORMAL HIGH (ref 65–99)
Potassium: 3.9 mmol/L (ref 3.5–5.2)
Sodium: 140 mmol/L (ref 134–144)
Total Protein: 6.3 g/dL (ref 6.0–8.5)

## 2018-11-27 LAB — HGB A1C W/O EAG: Hgb A1c MFr Bld: 7.6 % — ABNORMAL HIGH (ref 4.8–5.6)

## 2019-01-14 ENCOUNTER — Other Ambulatory Visit: Payer: Self-pay

## 2019-01-14 ENCOUNTER — Other Ambulatory Visit: Payer: Managed Care, Other (non HMO)

## 2019-01-14 DIAGNOSIS — E119 Type 2 diabetes mellitus without complications: Secondary | ICD-10-CM

## 2019-01-14 DIAGNOSIS — E669 Obesity, unspecified: Secondary | ICD-10-CM

## 2019-01-14 DIAGNOSIS — E559 Vitamin D deficiency, unspecified: Secondary | ICD-10-CM

## 2019-01-14 DIAGNOSIS — R5383 Other fatigue: Secondary | ICD-10-CM | POA: Diagnosis not present

## 2019-01-14 DIAGNOSIS — E785 Hyperlipidemia, unspecified: Secondary | ICD-10-CM

## 2019-01-14 DIAGNOSIS — D519 Vitamin B12 deficiency anemia, unspecified: Secondary | ICD-10-CM

## 2019-01-14 DIAGNOSIS — I10 Essential (primary) hypertension: Secondary | ICD-10-CM

## 2019-01-15 LAB — TSH: TSH: 1.3 u[IU]/mL (ref 0.450–4.500)

## 2019-01-15 LAB — VITAMIN D 25 HYDROXY (VIT D DEFICIENCY, FRACTURES): Vit D, 25-Hydroxy: 29.7 ng/mL — ABNORMAL LOW (ref 30.0–100.0)

## 2019-03-27 ENCOUNTER — Other Ambulatory Visit: Payer: Self-pay

## 2019-04-10 ENCOUNTER — Other Ambulatory Visit: Payer: Self-pay

## 2019-04-10 ENCOUNTER — Other Ambulatory Visit: Payer: Managed Care, Other (non HMO)

## 2019-04-10 DIAGNOSIS — E559 Vitamin D deficiency, unspecified: Secondary | ICD-10-CM

## 2019-04-10 DIAGNOSIS — D519 Vitamin B12 deficiency anemia, unspecified: Secondary | ICD-10-CM

## 2019-04-10 DIAGNOSIS — I10 Essential (primary) hypertension: Secondary | ICD-10-CM

## 2019-04-10 DIAGNOSIS — E119 Type 2 diabetes mellitus without complications: Secondary | ICD-10-CM | POA: Diagnosis not present

## 2019-04-10 DIAGNOSIS — E785 Hyperlipidemia, unspecified: Secondary | ICD-10-CM

## 2019-04-10 NOTE — Addendum Note (Signed)
Addended by: Jay Schlichter D on: 04/10/2019 01:16 PM   Modules accepted: Orders

## 2019-04-11 LAB — MICROALBUMIN / CREATININE URINE RATIO
Creatinine, Urine: 73.6 mg/dL
Microalb/Creat Ratio: 28 mg/g creat (ref 0–29)
Microalbumin, Urine: 20.6 ug/mL

## 2019-04-13 LAB — LIPID PANEL
Chol/HDL Ratio: 2.4 ratio (ref 0.0–4.4)
Cholesterol, Total: 135 mg/dL (ref 100–199)
HDL: 57 mg/dL (ref >39)
LDL Chol Calc (NIH): 54 mg/dL (ref 0–99)
Triglycerides: 141 mg/dL (ref 0–149)
VLDL Cholesterol Cal: 24 mg/dL (ref 5–40)

## 2019-04-13 LAB — CBC WITH DIFFERENTIAL/PLATELET
Basophils Absolute: 0 10*3/uL (ref 0.0–0.2)
Basos: 0 %
EOS (ABSOLUTE): 0.1 10*3/uL (ref 0.0–0.4)
Eos: 1 %
Hematocrit: 43.9 % (ref 34.0–46.6)
Hemoglobin: 14.8 g/dL (ref 11.1–15.9)
Immature Grans (Abs): 0 10*3/uL (ref 0.0–0.1)
Immature Granulocytes: 0 %
Lymphocytes Absolute: 2.9 10*3/uL (ref 0.7–3.1)
Lymphs: 36 %
MCH: 29.7 pg (ref 26.6–33.0)
MCHC: 33.7 g/dL (ref 31.5–35.7)
MCV: 88 fL (ref 79–97)
Monocytes Absolute: 0.4 10*3/uL (ref 0.1–0.9)
Monocytes: 5 %
Neutrophils Absolute: 4.5 10*3/uL (ref 1.4–7.0)
Neutrophils: 58 %
Platelets: 295 10*3/uL (ref 150–450)
RBC: 4.99 x10E6/uL (ref 3.77–5.28)
RDW: 12.8 % (ref 11.7–15.4)
WBC: 7.9 10*3/uL (ref 3.4–10.8)

## 2019-04-13 LAB — COMPREHENSIVE METABOLIC PANEL
ALT: 22 IU/L (ref 0–32)
AST: 19 IU/L (ref 0–40)
Albumin/Globulin Ratio: 1.6 (ref 1.2–2.2)
Albumin: 3.9 g/dL (ref 3.8–4.8)
Alkaline Phosphatase: 124 IU/L — ABNORMAL HIGH (ref 39–117)
BUN/Creatinine Ratio: 20 (ref 9–23)
BUN: 12 mg/dL (ref 6–24)
Bilirubin Total: 0.3 mg/dL (ref 0.0–1.2)
CO2: 25 mmol/L (ref 20–29)
Calcium: 9.4 mg/dL (ref 8.7–10.2)
Chloride: 99 mmol/L (ref 96–106)
Creatinine, Ser: 0.6 mg/dL (ref 0.57–1.00)
GFR calc Af Amer: 124 mL/min/{1.73_m2} (ref >59)
GFR calc non Af Amer: 107 mL/min/{1.73_m2} (ref >59)
Globulin, Total: 2.5 g/dL (ref 1.5–4.5)
Glucose: 159 mg/dL — ABNORMAL HIGH (ref 65–99)
Potassium: 3.9 mmol/L (ref 3.5–5.2)
Sodium: 139 mmol/L (ref 134–144)
Total Protein: 6.4 g/dL (ref 6.0–8.5)

## 2019-04-13 LAB — HGB A1C W/O EAG: Hgb A1c MFr Bld: 8.6 % — ABNORMAL HIGH (ref 4.8–5.6)

## 2019-04-13 LAB — VITAMIN B12: Vitamin B-12: 272 pg/mL (ref 232–1245)

## 2019-04-13 LAB — TSH: TSH: 2.81 u[IU]/mL (ref 0.450–4.500)

## 2019-04-13 LAB — VITAMIN D 25 HYDROXY (VIT D DEFICIENCY, FRACTURES): Vit D, 25-Hydroxy: 24.3 ng/mL — ABNORMAL LOW (ref 30.0–100.0)

## 2019-04-28 ENCOUNTER — Other Ambulatory Visit: Payer: Self-pay | Admitting: Nurse Practitioner

## 2019-04-28 DIAGNOSIS — Z1231 Encounter for screening mammogram for malignant neoplasm of breast: Secondary | ICD-10-CM

## 2019-05-06 ENCOUNTER — Ambulatory Visit: Payer: No Typology Code available for payment source

## 2019-07-11 ENCOUNTER — Ambulatory Visit: Payer: Managed Care, Other (non HMO) | Attending: Internal Medicine

## 2019-07-11 DIAGNOSIS — Z23 Encounter for immunization: Secondary | ICD-10-CM

## 2019-07-11 NOTE — Progress Notes (Signed)
   Covid-19 Vaccination Clinic  Name:  Kimberly Wilson    MRN: [id number] DOB: 1970/08/14  07/11/2019  Kimberly Wilson was observed post Covid-19 immunization for 15 minutes without incident. She was provided with Vaccine Information Sheet and instruction to access the V-Safe system.   Kimberly Wilson was instructed to call 911 with any severe reactions post vaccine: Marland Kitchen Difficulty breathing  . Swelling of face and throat  . A fast heartbeat  . A bad rash all over body  . Dizziness and weakness   Immunizations Administered    Name Date Dose VIS Date Route   Pfizer COVID-19 Vaccine 07/11/2019  8:57 AM 0.3 mL 04/03/2019 Intramuscular   Manufacturer: [employer], [employer]   Lot: [id number]   NDC: [address]-7

## 2019-07-14 ENCOUNTER — Ambulatory Visit
Admission: RE | Admit: 2019-07-14 | Discharge: 2019-07-14 | Disposition: A | Discharge status: Home / Self Care | Payer: Managed Care, Other (non HMO) | Source: Ambulatory Visit | Attending: Nurse Practitioner | Admitting: Nurse Practitioner

## 2019-07-14 DIAGNOSIS — Z1231 Encounter for screening mammogram for malignant neoplasm of breast: Secondary | ICD-10-CM | POA: Diagnosis not present

## 2019-08-04 ENCOUNTER — Ambulatory Visit: Payer: Managed Care, Other (non HMO) | Attending: Internal Medicine

## 2019-08-04 DIAGNOSIS — Z23 Encounter for immunization: Secondary | ICD-10-CM

## 2019-08-04 NOTE — Progress Notes (Signed)
   Covid-19 Vaccination Clinic  Name:  Kimberly Wilson    MRN: [id number] DOB: Jul 08, 1970  08/04/2019  Kimberly Wilson was observed post Covid-19 immunization for 30 minutes based on pre-vaccination screening without incident. She was provided with Vaccine Information Sheet and instruction to access the V-Safe system.   Kimberly Wilson was instructed to call 911 with any severe reactions post vaccine: Marland Kitchen Difficulty breathing  . Swelling of face and throat  . A fast heartbeat  . A bad rash all over body  . Dizziness and weakness   Immunizations Administered    Name Date Dose VIS Date Route   Pfizer COVID-19 Vaccine 08/04/2019 12:13 PM 0.3 mL 04/03/2019 Intramuscular   Manufacturer: [employer], [employer]   Lot: [id number]   NDC: [address]-0

## 2019-09-07 ENCOUNTER — Other Ambulatory Visit: Payer: Managed Care, Other (non HMO)

## 2019-09-08 ENCOUNTER — Other Ambulatory Visit: Payer: Self-pay

## 2019-09-08 ENCOUNTER — Other Ambulatory Visit: Payer: Managed Care, Other (non HMO)

## 2019-09-08 VITALS — BP 138/62 | HR 64 | Temp 98.0°F | Resp 18 | Ht 62.0 in | Wt 269.0 lb

## 2019-09-08 DIAGNOSIS — I10 Essential (primary) hypertension: Secondary | ICD-10-CM

## 2019-09-08 DIAGNOSIS — Z008 Encounter for other general examination: Secondary | ICD-10-CM | POA: Diagnosis not present

## 2019-09-08 DIAGNOSIS — IMO0002 Reserved for concepts with insufficient information to code with codable children: Secondary | ICD-10-CM

## 2019-09-08 DIAGNOSIS — E1065 Type 1 diabetes mellitus with hyperglycemia: Secondary | ICD-10-CM

## 2019-09-08 DIAGNOSIS — E559 Vitamin D deficiency, unspecified: Secondary | ICD-10-CM | POA: Diagnosis not present

## 2019-09-08 DIAGNOSIS — G603 Idiopathic progressive neuropathy: Secondary | ICD-10-CM

## 2019-09-08 DIAGNOSIS — E1051 Type 1 diabetes mellitus with diabetic peripheral angiopathy without gangrene: Secondary | ICD-10-CM | POA: Diagnosis not present

## 2019-09-10 LAB — LIPID PANEL
Chol/HDL Ratio: 3.9 ratio (ref 0.0–4.4)
Cholesterol, Total: 201 mg/dL — ABNORMAL HIGH (ref 100–199)
HDL: 52 mg/dL (ref >39)
LDL Chol Calc (NIH): 99 mg/dL (ref 0–99)
Triglycerides: 297 mg/dL — ABNORMAL HIGH (ref 0–149)
VLDL Cholesterol Cal: 50 mg/dL — ABNORMAL HIGH (ref 5–40)

## 2019-09-10 LAB — VITAMIN B12: Vitamin B-12: 271 pg/mL (ref 232–1245)

## 2019-09-10 LAB — BASIC METABOLIC PANEL
BUN/Creatinine Ratio: 16 (ref 9–23)
BUN: 11 mg/dL (ref 6–24)
CO2: 22 mmol/L (ref 20–29)
Calcium: 8.8 mg/dL (ref 8.7–10.2)
Chloride: 103 mmol/L (ref 96–106)
Creatinine, Ser: 0.67 mg/dL (ref 0.57–1.00)
GFR calc Af Amer: 119 mL/min/{1.73_m2} (ref >59)
GFR calc non Af Amer: 104 mL/min/{1.73_m2} (ref >59)
Glucose: 185 mg/dL — ABNORMAL HIGH (ref 65–99)
Potassium: 4 mmol/L (ref 3.5–5.2)
Sodium: 140 mmol/L (ref 134–144)

## 2019-09-10 LAB — HGB A1C W/O EAG: Hgb A1c MFr Bld: 6.5 % — ABNORMAL HIGH (ref 4.8–5.6)

## 2019-09-10 LAB — TSH: TSH: 2.07 u[IU]/mL (ref 0.450–4.500)

## 2019-09-10 LAB — VITAMIN D 25 HYDROXY (VIT D DEFICIENCY, FRACTURES)

## 2020-03-08 ENCOUNTER — Other Ambulatory Visit: Payer: Managed Care, Other (non HMO)

## 2020-03-08 ENCOUNTER — Other Ambulatory Visit: Payer: Self-pay

## 2020-03-08 DIAGNOSIS — E119 Type 2 diabetes mellitus without complications: Secondary | ICD-10-CM

## 2020-03-08 DIAGNOSIS — E785 Hyperlipidemia, unspecified: Secondary | ICD-10-CM

## 2020-03-08 DIAGNOSIS — E559 Vitamin D deficiency, unspecified: Secondary | ICD-10-CM

## 2020-03-08 NOTE — Addendum Note (Signed)
Addended by: Fabio Bering D on: 03/08/2020 08:43 AM   Modules accepted: Orders

## 2020-03-08 NOTE — Addendum Note (Signed)
Addended by: Fabio Bering D on: 03/08/2020 08:41 AM   Modules accepted: Orders

## 2020-03-09 LAB — COMPREHENSIVE METABOLIC PANEL
ALT: 23 IU/L (ref 0–32)
AST: 22 IU/L (ref 0–40)
Albumin/Globulin Ratio: 1.7 (ref 1.2–2.2)
Albumin: 4.1 g/dL (ref 3.8–4.8)
Alkaline Phosphatase: 125 IU/L — ABNORMAL HIGH (ref 44–121)
BUN/Creatinine Ratio: 18 (ref 9–23)
BUN: 12 mg/dL (ref 6–24)
Bilirubin Total: 0.2 mg/dL (ref 0.0–1.2)
CO2: 21 mmol/L (ref 20–29)
Calcium: 9.4 mg/dL (ref 8.7–10.2)
Chloride: 98 mmol/L (ref 96–106)
Creatinine, Ser: 0.66 mg/dL (ref 0.57–1.00)
GFR calc Af Amer: 119 mL/min/{1.73_m2} (ref >59)
GFR calc non Af Amer: 103 mL/min/{1.73_m2} (ref >59)
Globulin, Total: 2.4 g/dL (ref 1.5–4.5)
Glucose: 118 mg/dL — ABNORMAL HIGH (ref 65–99)
Potassium: 4.2 mmol/L (ref 3.5–5.2)
Sodium: 138 mmol/L (ref 134–144)
Total Protein: 6.5 g/dL (ref 6.0–8.5)

## 2020-03-09 LAB — LIPID PANEL
Chol/HDL Ratio: 4.2 ratio (ref 0.0–4.4)
Cholesterol, Total: 208 mg/dL — ABNORMAL HIGH (ref 100–199)
HDL: 49 mg/dL (ref >39)
LDL Chol Calc (NIH): 131 mg/dL — ABNORMAL HIGH (ref 0–99)
Triglycerides: 160 mg/dL — ABNORMAL HIGH (ref 0–149)
VLDL Cholesterol Cal: 28 mg/dL (ref 5–40)

## 2020-03-09 LAB — TSH: TSH: 1.95 u[IU]/mL (ref 0.450–4.500)

## 2020-03-09 LAB — VITAMIN D 25 HYDROXY (VIT D DEFICIENCY, FRACTURES): Vit D, 25-Hydroxy: 24.7 ng/mL — ABNORMAL LOW (ref 30.0–100.0)

## 2020-03-09 LAB — MICROALBUMIN / CREATININE URINE RATIO
Creatinine, Urine: 113.9 mg/dL
Microalb/Creat Ratio: 8 mg/g creat (ref 0–29)
Microalbumin, Urine: 9.2 ug/mL

## 2020-03-09 LAB — HGB A1C W/O EAG: Hgb A1c MFr Bld: 6.7 % — ABNORMAL HIGH (ref 4.8–5.6)

## 2021-12-20 IMAGING — MG DIGITAL SCREENING BILAT W/ TOMO W/ CAD
8 series · 8 of 24 positions shown · non-contrast
Comparison: Previous exam(s).

CLINICAL DATA: Screening.

EXAM:
DIGITAL SCREENING BILATERAL MAMMOGRAM WITH TOMO AND CAD

[L CC synth-2D]
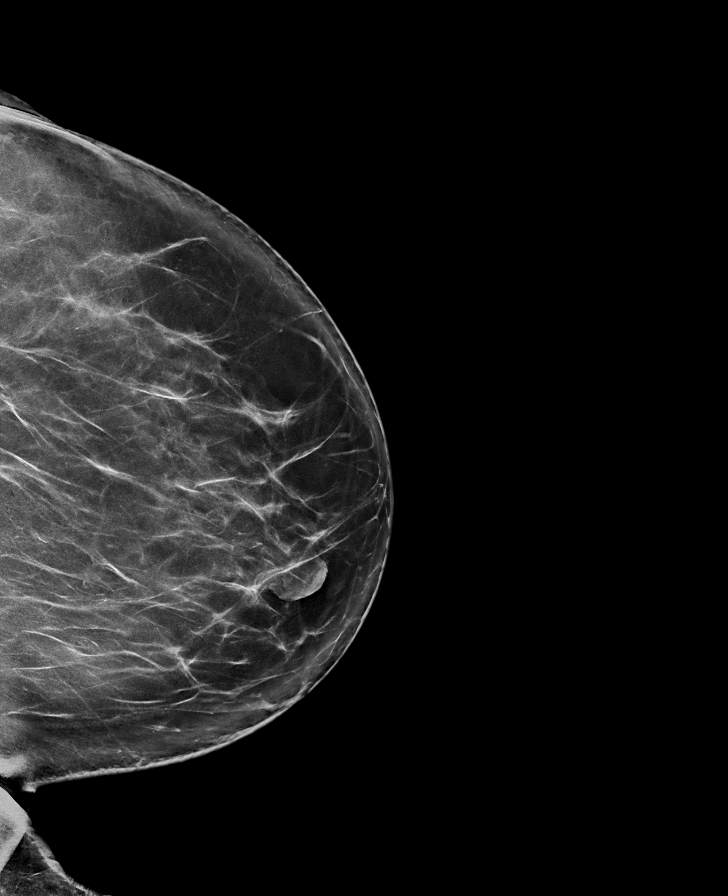

[R MLO synth-2D]
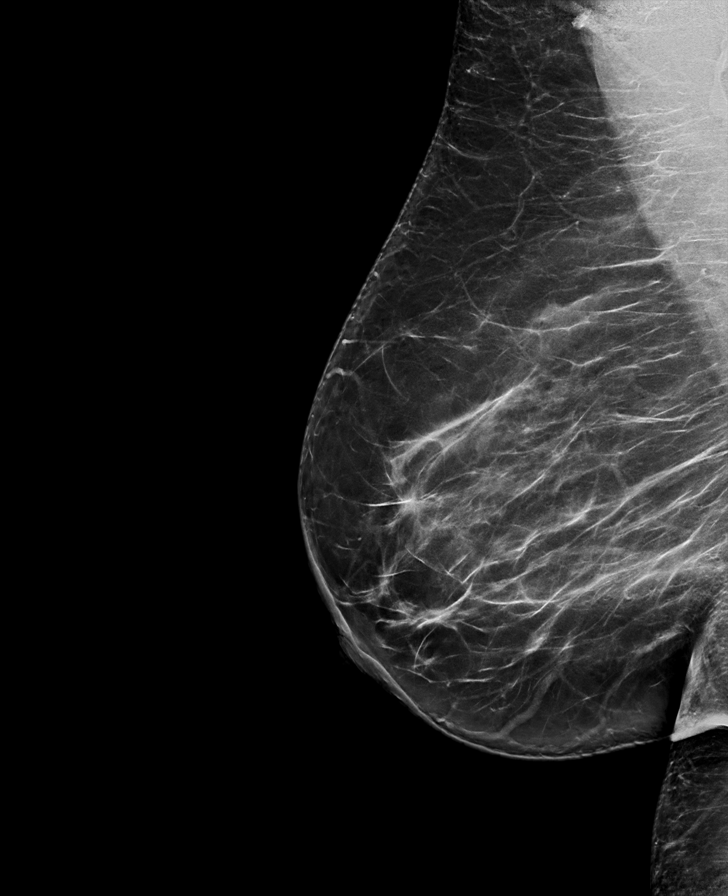

[R CC synth-2D]
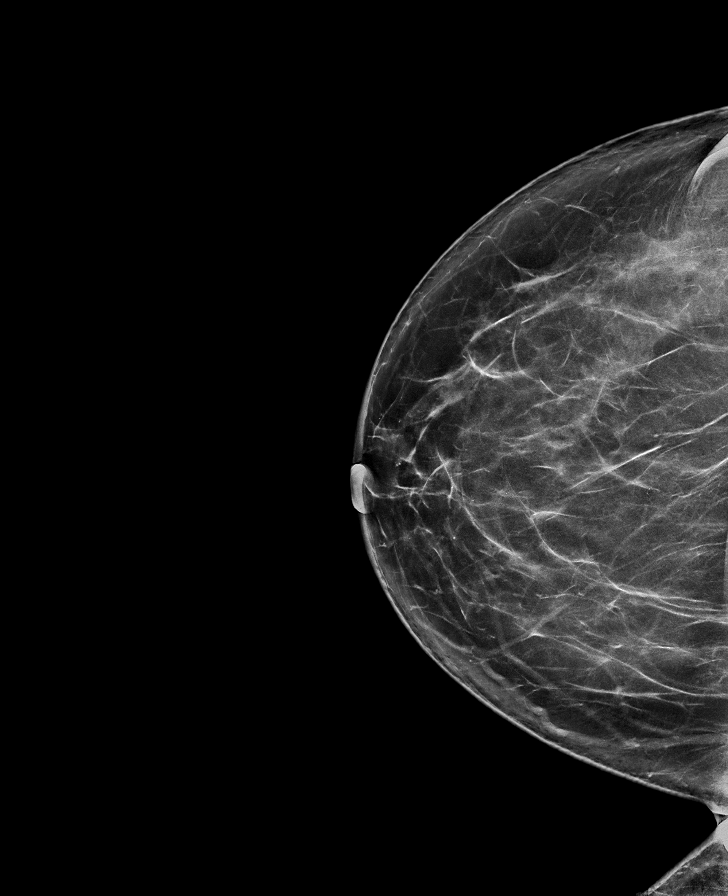

[L MLO synth-2D]
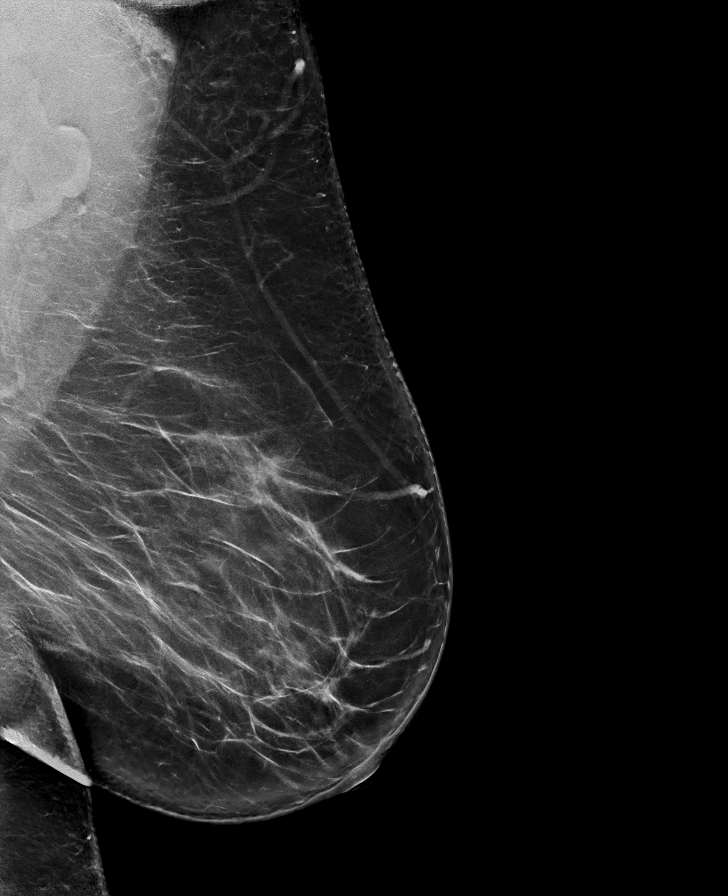

[L CC tomo · tomo slice 46/91.0]
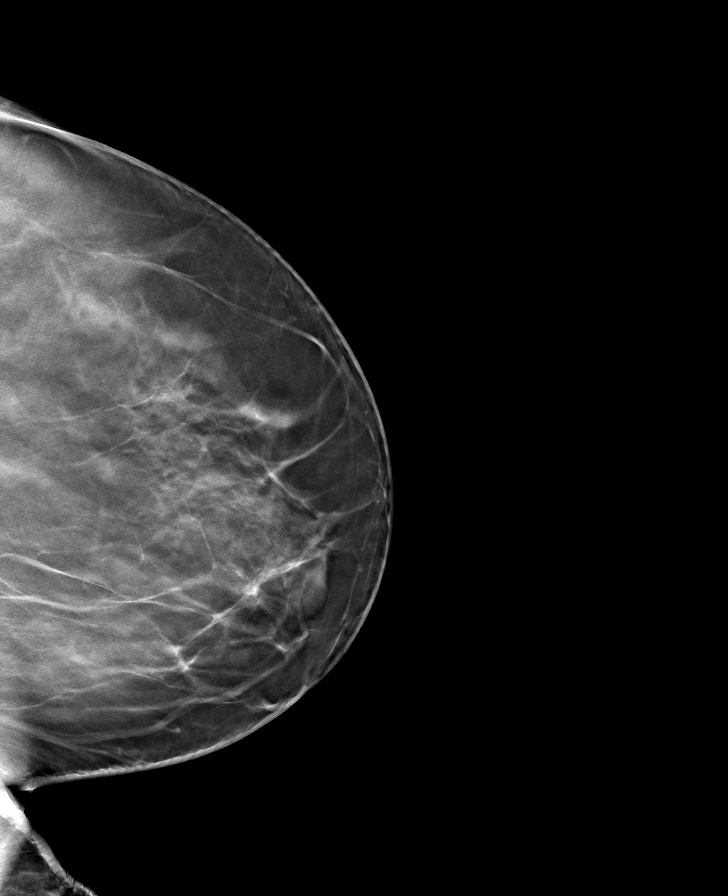

[R MLO tomo · tomo slice 45/89.0]
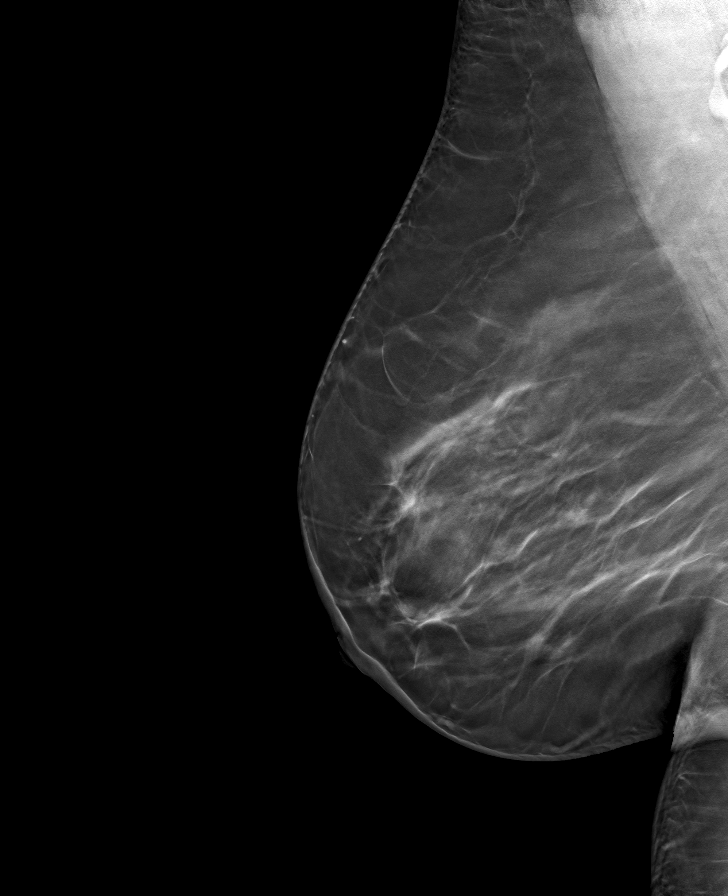

[R CC tomo · tomo slice 43/85.0]
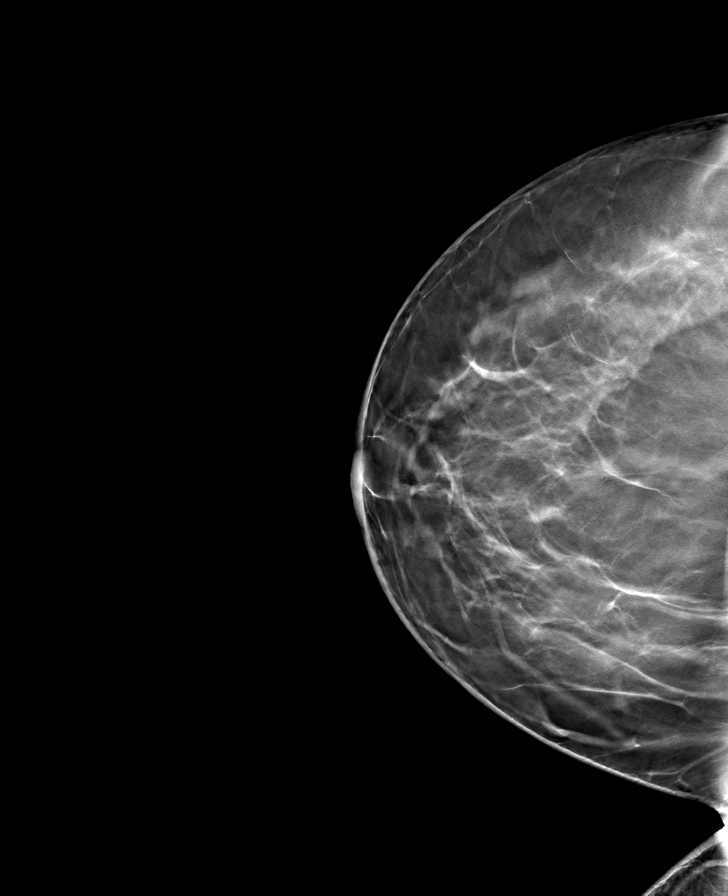

[L MLO tomo · tomo slice 49/97.0]
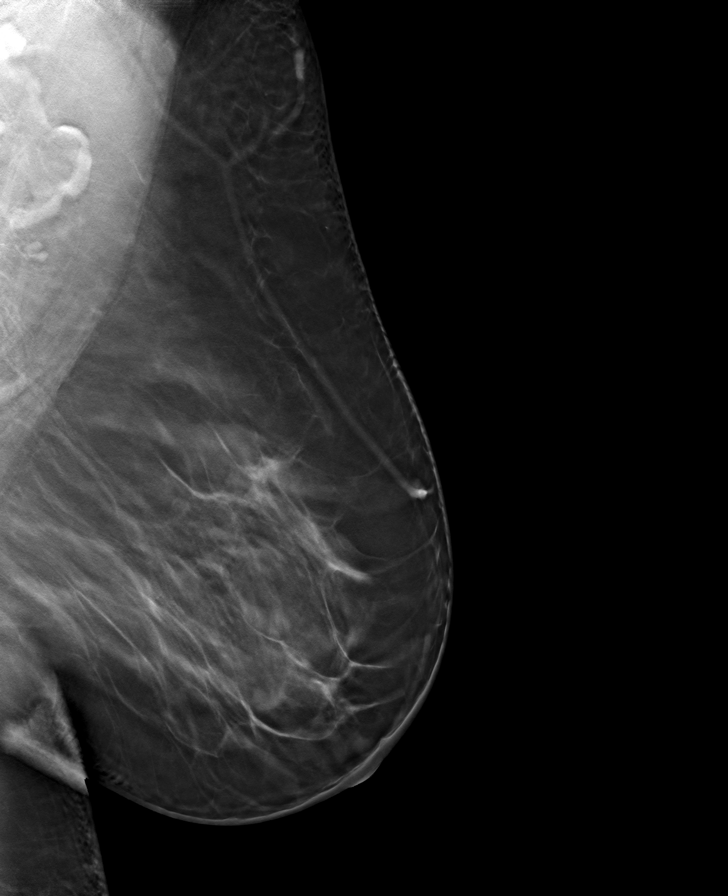

[8 of 24 positions shown; findings below may reference images not displayed]

ACR Breast Density Category b: There are scattered areas of
fibroglandular density.
FINDINGS: There are no findings suspicious for malignancy. Images were
processed with CAD.
IMPRESSION: No mammographic evidence of malignancy. A result letter of this
screening mammogram will be mailed directly to the patient.

RECOMMENDATION:
Screening mammogram in one year. (Code:[id number])

BI-RADS CATEGORY  1: Negative.

## 2022-09-06 ENCOUNTER — Other Ambulatory Visit: Payer: Self-pay | Admitting: Nurse Practitioner

## 2022-09-07 ENCOUNTER — Other Ambulatory Visit: Payer: Self-pay

## 2022-09-07 ENCOUNTER — Telehealth: Payer: Self-pay

## 2022-09-07 NOTE — Telephone Encounter (Signed)
Patient needs lab order mailed to her home, she is scheduling appointment in August to come see you, please put in the order

## 2022-09-13 ENCOUNTER — Other Ambulatory Visit: Payer: Self-pay | Admitting: Nurse Practitioner

## 2022-09-13 DIAGNOSIS — E782 Mixed hyperlipidemia: Secondary | ICD-10-CM

## 2022-09-13 DIAGNOSIS — R5383 Other fatigue: Secondary | ICD-10-CM

## 2022-09-13 DIAGNOSIS — I10 Essential (primary) hypertension: Secondary | ICD-10-CM

## 2022-09-13 NOTE — Telephone Encounter (Signed)
Printed out and laid on your desk for her

## 2022-09-13 NOTE — Telephone Encounter (Signed)
Sent message to patient awaiting response

## 2022-11-05 ENCOUNTER — Other Ambulatory Visit: Payer: Self-pay | Admitting: Nurse Practitioner

## 2022-11-29 ENCOUNTER — Ambulatory Visit: Payer: Managed Care, Other (non HMO) | Admitting: Nurse Practitioner

## 2022-12-13 ENCOUNTER — Other Ambulatory Visit: Payer: Self-pay

## 2022-12-14 MED ORDER — LOSARTAN POTASSIUM 100 MG PO TABS: 100.0000 mg | ORAL_TABLET | Freq: Every day | ORAL | 0 refills | Status: DC

## 2023-01-23 ENCOUNTER — Encounter: Payer: Self-pay | Admitting: Cardiology

## 2023-01-23 ENCOUNTER — Ambulatory Visit: Payer: Managed Care, Other (non HMO) | Admitting: Cardiology

## 2023-01-23 VITALS — BP 149/82 | HR 110 | Ht 64.0 in | Wt 290.0 lb

## 2023-01-23 DIAGNOSIS — I10 Essential (primary) hypertension: Secondary | ICD-10-CM | POA: Diagnosis not present

## 2023-01-23 DIAGNOSIS — Z6841 Body Mass Index (BMI) 40.0 and over, adult: Secondary | ICD-10-CM

## 2023-01-23 DIAGNOSIS — E119 Type 2 diabetes mellitus without complications: Secondary | ICD-10-CM | POA: Insufficient documentation

## 2023-01-23 DIAGNOSIS — E66813 Obesity, class 3: Secondary | ICD-10-CM

## 2023-01-23 DIAGNOSIS — E782 Mixed hyperlipidemia: Secondary | ICD-10-CM | POA: Diagnosis not present

## 2023-01-23 MED ORDER — EZETIMIBE 10 MG PO TABS: 10.0000 mg | ORAL_TABLET | Freq: Every day | ORAL | 3 refills | Status: AC

## 2023-01-23 MED ORDER — HYDROCHLOROTHIAZIDE 25 MG PO TABS: 25.0000 mg | ORAL_TABLET | Freq: Every day | ORAL | 3 refills | Status: AC

## 2023-01-23 MED ORDER — LOSARTAN POTASSIUM 100 MG PO TABS: 100.0000 mg | ORAL_TABLET | Freq: Every day | ORAL | 3 refills | Status: AC

## 2023-01-23 MED ORDER — SERTRALINE HCL 50 MG PO TABS: 50.0000 mg | ORAL_TABLET | Freq: Every day | ORAL | 3 refills | Status: AC

## 2023-01-23 NOTE — Progress Notes (Signed)
Established Patient Office Visit  Subjective:  Patient ID: Kimberly Wilson, female    DOB: Sep 13, 1970  Age: 52 y.o. MRN: [id number]  Chief Complaint  Patient presents with   Follow-up    Medication refills     Patient in office for regular follow up. Patient has not been seen in the office since 07/2021. Patient reports doing well overall. States her knees hurt some. Patient starting [facility] per endocrinology for her DM and for weight loss. Has family members that are nutritionists or exercise trainers who will be helping her.  Patient states she is scheduled for a mammogram in March 2025.  Discussed colon cancer screening, patient wants to think about it and let us know.  Labs done by endocrinology, copy of results not currently available.  Will plan for pap smear at next visit.     No other concerns at this time.   Past Medical History:  Diagnosis Date   Allergic rhinitis due to pollen    Diabetes mellitus without complication (HCC)    Hemorrhoids without complication    Hypertension     History reviewed. No pertinent surgical history.  Social History   Socioeconomic History   Marital status: Married    Spouse name: Not on file   Number of children: Not on file   Years of education: Not on file   Highest education level: Not on file  Occupational History   Not on file  Tobacco Use   Smoking status: Never   Smokeless tobacco: Never  Substance and Sexual Activity   Alcohol use: Yes   Drug use: No   Sexual activity: Not on file  Other Topics Concern   Not on file  Social History Narrative   Not on file   Social Determinants of Health   Financial Resource Strain: Not on file  Food Insecurity: Not on file  Transportation Needs: Not on file  Physical Activity: Not on file  Stress: Not on file  Social Connections: Not on file  Intimate Partner Violence: Not on file    Family History  Adopted: Yes  Problem Relation Age of Onset   Hypertension Mother     Hyperlipidemia Father     Allergies  Allergen Reactions   Shellfish Allergy Anaphylaxis and Hives   Sulfa Antibiotics Anaphylaxis and Hives    Review of Systems  Constitutional: Negative.   HENT: Negative.    Eyes: Negative.   Respiratory: Negative.  Negative for shortness of breath.   Cardiovascular: Negative.  Negative for chest pain.  Gastrointestinal: Negative.  Negative for abdominal pain, constipation and diarrhea.  Genitourinary: Negative.   Musculoskeletal:  Positive for joint pain. Negative for myalgias.  Skin: Negative.   Neurological: Negative.  Negative for dizziness and headaches.  Endo/Heme/Allergies: Negative.   All other systems reviewed and are negative.      Objective:   BP (!) 149/82   Pulse (!) 110   Ht 5\' 4"  (1.626 m)   Wt 290 lb (131.5 kg)   SpO2 98%   BMI 49.78 kg/m   Vitals:   01/23/23 0902  BP: (!) 149/82  Pulse: (!) 110  Height: 5\' 4"  (1.626 m)  Weight: 290 lb (131.5 kg)  SpO2: 98%  BMI (Calculated): 49.75    Physical Exam Vitals and nursing note reviewed.  Constitutional:      Appearance: Normal appearance. She is normal weight.  HENT:     Head: Normocephalic and atraumatic.     Nose: Nose normal.  Mouth/Throat:     Mouth: Mucous membranes are moist.  Eyes:     Extraocular Movements: Extraocular movements intact.     Conjunctiva/sclera: Conjunctivae normal.     Pupils: Pupils are equal, round, and reactive to light.  Cardiovascular:     Rate and Rhythm: Normal rate and regular rhythm.     Pulses: Normal pulses.     Heart sounds: Normal heart sounds.  Pulmonary:     Effort: Pulmonary effort is normal.     Breath sounds: Normal breath sounds.  Abdominal:     General: Abdomen is flat. Bowel sounds are normal.     Palpations: Abdomen is soft.  Musculoskeletal:        General: Normal range of motion.     Cervical back: Normal range of motion.  Skin:    General: Skin is warm and dry.  Neurological:     General: No  focal deficit present.     Mental Status: She is alert and oriented to person, place, and time.  Psychiatric:        Mood and Affect: Mood normal.        Behavior: Behavior normal.        Thought Content: Thought content normal.        Judgment: Judgment normal.      No results found for any visits on 01/23/23.  No results found for this or any previous visit (from the past 2160 hour(s)).    Assessment & Plan:  Continue tow ork on diet and exercise.  Mammogram 06/2023 Consider colon cancer screening.  Pap smear at next visit.   Problem List Items Addressed This Visit       Cardiovascular and Mediastinum   Hypertension - Primary   Relevant Medications   ezetimibe (ZETIA) 10 MG tablet   hydrochlorothiazide (HYDRODIURIL) 25 MG tablet   losartan (COZAAR) 100 MG tablet     Endocrine   Diabetes mellitus without complication (HCC)   Relevant Medications   tirzepatide (MOUNJARO) 2.5 MG/0.5ML Pen   losartan (COZAAR) 100 MG tablet     Other   Class 3 severe obesity due to excess calories with serious comorbidity and body mass index (BMI) of 45.0 to 49.9 in adult [facility])   Relevant Medications   tirzepatide (MOUNJARO) 2.5 MG/0.5ML Pen   Mixed hyperlipidemia   Relevant Medications   ezetimibe (ZETIA) 10 MG tablet   hydrochlorothiazide (HYDRODIURIL) 25 MG tablet   losartan (COZAAR) 100 MG tablet    Return in about 4 months (around 05/26/2023).   Total time spent: 25 minutes  [employer], NP  01/23/2023   This document may have been prepared by Dragon Voice Recognition software and as such may include unintentional dictation errors.

## 2023-03-21 ENCOUNTER — Other Ambulatory Visit: Payer: Self-pay | Admitting: Internal Medicine

## 2023-04-03 NOTE — Progress Notes (Signed)
 Chief Complaint:   Chief Complaint  Patient presents with  . Establish Care    Subjective  Kimberly Wilson is a 52 y.o. female established patient in today for: HPI:  Establish Care  Currently Treated for Following Conditions:  DM2 - managed by endo, recently started on Mounjaro  Hypertension -currently managed on losartan  100 mg daily, HCTZ 25 mg daily, hydralazine 50 mg 3 times daily, and clonidine 0.1 mg daily.  She reports compliance issues with hydralazine dosing schedule  Hyperlipidemia -to tolerate statins due to myalgias.  Has tried rosuvastatin and atorvastatin.  Currently on Zetia .  GAD -anxiety, worse since spouse passed away in 09-Feb-2021.  She feels her symptoms are well-managed on Zoloft  50 mg daily  Daily Meds and Refills: Patient request refills of hydralazine, hydrochlorothiazide , Zetia , and Zoloft   Specialists: Solum in Endo. Next appt in January  Significant PMH, PSH, and Fam Hx: No surgical hx  Social Hx: Widowed.  Lives alone.  Denies tobacco, alcohol, or substance use.  Has 1 grown daughter and 1 grandson who live nearby. Social History   Social History Narrative  . Not on file      Patient Active Problem List  Diagnosis  . Type 2 diabetes mellitus with diabetic microalbuminuria, with long-term current use of insulin (CMS/HHS-HCC)  . Essential hypertension  . Hypercholesterolemia  . GAD (generalized anxiety disorder)    Outpatient Medications Prior to Visit  Medication Sig Dispense Refill  . blood glucose diagnostic test strip Use 3 (three) times daily 300 each 3  . fluticasone propionate (FLONASE) 50 mcg/actuation nasal spray Place 2 sprays into both nostrils once daily    . insulin DEGLUDEC (TRESIBA FLEXTOUCH U-200) pen injector (concentration 200 units/mL) ADMINISTER UP TO 100 UNITS UNDER THE SKIN DAILY AS DIRECTED (Patient taking differently: 100 UNITS UNDER THE SKIN DAILY AS DIRECTED) 30 mL 11  . insulin LISPRO (HUMALOG KWIKPEN) pen  injector (concentration 100 units/mL) Inject 16 Units subcutaneously daily with dinner (Patient taking differently: Inject 17 Units subcutaneously daily with dinner) 15 mL 11  . losartan  (COZAAR ) 100 MG tablet Take 100 mg by mouth once daily    . omeprazole  (PRILOSEC) 20 MG DR capsule Take 20 mg by mouth once daily    . pen needle, diabetic (BD ULTRA-FINE MINI PEN NEEDLE) 31 gauge x 3/16 needle Use twice daily 200 each 3  . tirzepatide (MOUNJARO) 5 mg/0.5 mL pen injector Inject 0.5 mLs (5 mg total) subcutaneously once a week (Patient taking differently: Inject 5 mg subcutaneously once a week Saturday.) 6 mL 1  . BUTALBITAL-ACETAMINOPHEN ORAL Take by mouth as needed    . cloNIDine HCl (CATAPRES) 0.1 MG tablet Take 0.1 mg by mouth once daily    . blood glucose meter kit Use as directed (Patient not taking: Reported on 05/30/2018) 1 each 0  . ezetimibe  (ZETIA ) 10 mg tablet Take 1 tablet by mouth once daily    . hydrALAZINE (APRESOLINE) 50 MG tablet Take 50 mg by mouth every 8 (eight) hours       . hydroCHLOROthiazide  (HYDRODIURIL ) 25 MG tablet Take 25 mg by mouth once daily    . sertraline  (ZOLOFT ) 50 MG tablet Take 50 mg by mouth once daily     No facility-administered medications prior to visit.    ROS     Objective  Vitals:   04/03/23 1548 04/03/23 1549  BP:  (!) 163/101  Pulse:  103  Weight:  (!) 129.7 kg (286 lb)  Height:  160 cm (  5' 3)  PainSc: 0-No pain    Body mass index is 50.66 kg/m. Home Vitals:     Physical Exam Constitutional:      General: She is not in acute distress.    Appearance: Normal appearance. She is not ill-appearing.  HENT:     Head: Normocephalic and atraumatic.  Eyes:     Extraocular Movements: Extraocular movements intact.  Cardiovascular:     Rate and Rhythm: Normal rate.  Pulmonary:     Effort: Pulmonary effort is normal.  Musculoskeletal:     Cervical back: Normal range of motion.  Skin:    General: Skin is warm and dry.     Coloration:  Skin is not pale.  Neurological:     General: No focal deficit present.     Mental Status: She is alert and oriented to person, place, and time. Mental status is at baseline.  Psychiatric:        Mood and Affect: Mood normal.        Behavior: Behavior normal.       No results found for this visit on 04/03/23.     Assessment/Plan:   Diagnoses and all orders for this visit:  Type 2 diabetes mellitus with diabetic microalbuminuria, with long-term current use of insulin (CMS/HHS-HCC)  Depression screening (Z13.31) -     Depression Screen -(PHQ- 2/9, BDI)  Essential hypertension -     hydrALAZINE (APRESOLINE) 50 MG tablet; Take 1 tablet (50 mg total) by mouth every 8 (eight) hours Take for SBP (Top number) more than 140 -     hydroCHLOROthiazide  (HYDRODIURIL ) 25 MG tablet; Take 1 tablet (25 mg total) by mouth once daily  Hypercholesterolemia -     ezetimibe  (ZETIA ) 10 mg tablet; Take 1 tablet (10 mg total) by mouth once daily  GAD (generalized anxiety disorder) -     sertraline  (ZOLOFT ) 50 MG tablet; Take 1 tablet (50 mg total) by mouth once daily   Assessment & Plan Type 2 Diabetes Mellitus A1c elevated at 9.3. Currently on Mounjaro 5mg  with some weight loss and mild nausea. Previously had severe yeast infections with Jardiance and Farxiga. -Continue Mounjaro 5mg  daily. -Follow up with endocrinologist in January.  Hypertension Blood pressure elevated in office, possibly due to anxiety. Currently on Losartan  100mg , Hydralazine 50mg  TID, clonidine, and Hydrochlorothiazide  25mg . Clonidine to be discontinued due to short half-life and inconsistent blood pressure control.  Add amlodipine. -Start Amlodipine 5mg  daily. -Continue Losartan  100mg  and Hydrochlorothiazide  25mg  daily. -Take Hydralazine 50mg  as needed for systolic blood pressure >140. -Check blood pressure at home and send results via MyChart in one week.  Hyperlipidemia Currently on Zetia . Previous statin use caused  myalgias. -Continue Zetia .  Anxiety with Panic Attacks Well controlled on Sertraline  50mg . -Continue Sertraline  50mg  daily.  General Health Maintenance -Schedule mammogram. -Schedule physical and Pap smear for April/May 2025.  This visit was coded based on medical decision making (MDM).            Future Appointments     Date/Time Provider Department Center Visit Type   05/14/2023 8:45 AM Solum, Therisa Setter, MD [facility] [place] [facility] RETURN VISIT   10/03/2023 1:00 PM (Arrive by 12:45 PM) Perri Constance Sor, PA [facility] [place] [facility] PHYSICAL       There are no Patient Instructions on file for this visit.  An after visit summary was provided for the patient either in written format (printed) or through MyChart.

## 2023-11-08 NOTE — Progress Notes (Signed)
 Chief Complaint  Patient presents with  . Annual Exam  . Diabetes    Wants to discuss medications     Subjective:   Kimberly Wilson is a 53 y.o. female here for a health maintenance visit.  Patient is an established pt Additional concerns addressed today: Diabetes  HPI History of Present Illness Kimberly Wilson is a 53 year old female with diabetes and hypertension who presents for an annual physical exam and to discuss DM2, HTN, HL, and a yeast infection  Health Maintenance: Colon cancer screening: Overdue Breast cancer screening: Overdue Cervical cancer screening: Overdue Vaccines: declines  Yeast Infection She has been experiencing a yeast infection that has not resolved with over-the-counter treatments and requests a prescription for Diflucan .  HTN She has encountered issues with her blood pressure medication prescription not being filled by [facility], despite attempts to resolve the issue over three weeks. She has been monitoring her blood pressure carefully in the meantime. She is currently taking hydralazine 50 mg every eight hours, hydrochlorothiazide  25 mg, losartan  100 mg, and clonidine once in the morning.  DM2 She is dissatisfied with her current diabetes management, citing incorrect medication dosing and a lack of communication. She is currently taking Tresiba 100 units at night, Humalog 17 units with dinner, and Mounjaro 2.5 mg weekly. She reports occasional nausea after taking Mounjaro but no other significant side effects. Her last recorded A1c was 9.3 in September 2022. She would like to switch her DM care to this office.    Patient Active Problem List  Diagnosis  . Type 2 diabetes mellitus with diabetic microalbuminuria, with long-term current use of insulin (CMS/HHS-HCC)  . Essential hypertension  . Hypercholesterolemia  . GAD (generalized anxiety disorder)    Outpatient Medications Prior to Visit  Medication Sig Dispense Refill  .  cloNIDine HCL (CATAPRES) 0.1 MG tablet Take 0.1 mg by mouth every morning    . ezetimibe  (ZETIA ) 10 mg tablet Take 1 tablet (10 mg total) by mouth once daily 90 tablet 3  . fluticasone propionate (FLONASE) 50 mcg/actuation nasal spray Place 2 sprays into both nostrils once daily    . insulin DEGLUDEC (TRESIBA FLEXTOUCH U-200) pen injector (concentration 200 units/mL) ADMINISTER UP TO 100 UNITS UNDER THE SKIN DAILY AS DIRECTED 90 mL 1  . insulin LISPRO (HUMALOG KWIKPEN) pen injector (concentration 100 units/mL) Inject 17 Units subcutaneously daily with dinner 45 mL 1  . omeprazole  (PRILOSEC) 40 MG DR capsule Take 1 capsule (40 mg total) by mouth once daily 90 capsule 3  . pen needle, diabetic (BD ULTRA-FINE MINI PEN NEEDLE) 31 gauge x 3/16 needle Use twice daily 200 each 3  . sertraline  (ZOLOFT ) 50 MG tablet Take 1 tablet (50 mg total) by mouth once daily 90 tablet 3  . tirzepatide (MOUNJARO) 2.5 mg/0.5 mL pen injector Inject 0.5 mLs (2.5 mg total) subcutaneously once a week 2 mL 5  . hydrALAZINE (APRESOLINE) 50 MG tablet Take 1 tablet (50 mg total) by mouth every 8 (eight) hours Take for SBP (Top number) more than 140 270 tablet 3  . hydroCHLOROthiazide  (HYDRODIURIL ) 25 MG tablet Take 1 tablet (25 mg total) by mouth once daily 90 tablet 3  . losartan  (COZAAR ) 100 MG tablet Take 100 mg by mouth once daily    . blood glucose diagnostic test strip Use 3 (three) times daily (Patient not taking: Reported on 05/14/2023) 300 each 3  . blood glucose meter kit Use as directed (Patient not taking: Reported on 05/14/2023) 1  each 0   No facility-administered medications prior to visit.         04/03/2023  [employer] Authorization for Release of Information - [employer]   Are any of your needs urgent? No  Would you like help with any of the needs that you have identified? No    Social History   Tobacco Use  . Smoking status: Never  . Smokeless tobacco: Never  Vaping Use  . Vaping status: Never Used   Substance Use Topics  . Alcohol use: Not Currently  . Drug use: Never   Other Health Habits: Exercise: 0 times/week on average Current exercise activities: none Diet: well balanced Dental Exam: not up to date Eye Exam: not up to date  GYN: Sexual Health LMP: Patient's last menstrual period was 11/22/2015 (exact date). Patient has no period. Menopausal or had hysterectomy Last pap smear: see HM section History of abnormal pap smears: No Sexually active: No  Current contraception: abstinence  Health Maintenance: See under health Maintenance activity for review of completion dates as well. Immunization History  Administered Date(s) Administered  . COVID-19 Pfizer Monovalent Vaccine (original formulation) 07/11/2019, 08/04/2019  . Hepatitis B, unspecified 10/27/1997, 02/17/1998, 09/08/1998  . Influenza IIV4, IM pres-free 01/21/2019  . Influenza, IM unspecified 02/05/2019  . TD (>=69YR) VACCINE (TDVAX) 09/08/1998  . TDAP (>=69YR) VACCINE (ADACEL/BOOSTRIX) 12/25/2006, 05/21/2007    Health Maintenance Topics with due status: Overdue     Topic Date Due   HIV Screen Never done   Hepatitis C Screen Never done   Mammogram Never done   Pap Smear Never done   Colorectal Cancer Screening Never done   Annual Physical/Well Child Check Never done   Monofilament Foot Exam Never done   Diabetes Eye Assessment Exam Never done   Pneumococcal Vaccine: 50+ Never done   Shingrix Never done   Diabetes Education 09/14/2020   Annual Urine Albumin Creatinine Ratio 03/08/2021   Health Maintenance Topics with due status: Postponed     Topic Postponed Until   COVID-19 Vaccine 11/07/2024 (Originally 12/23/2022)   Adult Tetanus (Td And Tdap) 11/07/2024 (Originally 05/20/2017)   Health Maintenance Topics with due status: Not Due     Topic Last Completion Date   Influenza Vaccine 02/05/2019   Potassium Level 11/08/2023   Lipid Panel 11/08/2023   Hemoglobin A1C 11/08/2023   Serum Calcium  11/08/2023   Depression Screening 11/08/2023   Health Maintenance Topics with due status: Aged Out     Topic Date Due   Hib Vaccines Aged Out   Hepatitis A Vaccines Aged Out   Meningococcal B Vaccine Aged Out   Meningococcal ACWY Vaccine Aged Out   HPV Vaccines Aged Out    Depression Screen-PHQ2/9 PHQ-2 PHQ-2 Over the last 2 weeks, how often have you been bothered by any of the following problems? Little interest or pleasure in doing things: (Patient-Rptd) Not at all Feeling down, depressed, or hopeless: (Patient-Rptd) Not at all Patient Health Questionnaire-2 Score: (Patient-Rptd) 0  PHQ-9 (if PHQ >=3)    PHQ-2 Interpretation Values between 0-3 are considered not significant for depression  PHQ-9 Interpretation and Treatment Recommendations:  0-4= None  5-9= Mild / Treatment: Support, educate to call if worse; return in one month  10-14= Moderate / Treatment: Support, watchful waiting; Antidepressant or Psychotherapy  15-19= Moderately severe / Treatment: Antidepressant OR Psychotherapy  >= 20 = Major depression, severe / Antidepressant AND Psychotherapy  Review of Systems  Constitutional:  Negative for chills, fever and weight loss.  HENT:  Negative for congestion, hearing loss and sore throat.   Eyes:  Negative for blurred vision and double vision.  Respiratory:  Negative for cough and shortness of breath.   Cardiovascular:  Negative for chest pain, palpitations, orthopnea and leg swelling.  Gastrointestinal:  Negative for abdominal pain, constipation, diarrhea, nausea and vomiting.  Genitourinary:  Negative for dysuria.  Musculoskeletal:  Negative for myalgias.  Skin:  Negative for itching and rash.  Neurological:  Negative for dizziness, sensory change, speech change and weakness.  Psychiatric/Behavioral: Negative.       Objective:   Vitals:   11/08/23 0921 11/08/23 0922  BP:  (!) 141/79  Pulse:  68  Weight: (!) 124.6 kg (274 lb 9.6 oz)   PainSc: 0-No pain  0-No pain   Body mass index is 51.89 kg/m. Home vitals:     Physical Exam   Physical Exam Exam conducted with a chaperone present.  Constitutional:      General: She is not in acute distress.    Appearance: Normal appearance. She is not ill-appearing.  HENT:     Head: Normocephalic and atraumatic.     Mouth/Throat:     Mouth: Mucous membranes are moist.     Pharynx: Oropharynx is clear.  Eyes:     Extraocular Movements: Extraocular movements intact.     Pupils: Pupils are equal, round, and reactive to light.  Cardiovascular:     Rate and Rhythm: Normal rate and regular rhythm.     Pulses: Normal pulses.     Heart sounds: Normal heart sounds.  Pulmonary:     Effort: Pulmonary effort is normal.     Breath sounds: Normal breath sounds.  Abdominal:     General: Abdomen is flat. Bowel sounds are normal.     Palpations: Abdomen is soft.  Genitourinary:    General: Normal vulva.     Vagina: Vaginal discharge present.     Cervix: Normal.     Comments: Chaperone: Metta Barge, CMA Musculoskeletal:        General: Normal range of motion.  Skin:    General: Skin is warm and dry.     Coloration: Skin is not pale.  Neurological:     General: No focal deficit present.     Mental Status: She is alert and oriented to person, place, and time.  Psychiatric:        Mood and Affect: Mood normal.        Behavior: Behavior normal.     Assessment/Plan:   Patient was seen for a health maintenance exam.  Counseled the patient on health maintenance issues. Reviewed her health maintenance schedule and ordered appropriate tests. Counseled on regular exercise and weight management. Recommend regular eye exams and dental cleaning.   The following issues were addressed today for health maintenance: Diet - reviewed healthy diet and weight management Diet - discussed weight loss strategies Discussed importance of aerobic exercise and strength training Limit of 1 drink/day for women per  current guideline recommendations.  Dermatologist skin exam recommended Encouraged to increase or continue intake of vitamin D  (goal 1000-2000 Int Units/day) Encouraged to schedule routine dental follow up Encouraged to schedule routine eye check Follow up for annual exam in one year Recommended sunscreen usage  The patient's BMI is above the acceptable range; prescribed pharmacological treatment  Assessment & Plan Vulvovaginal candidiasis Recurrent infection unresponsive to OTC treatments. Requests prescription. - Prescribe Diflucan .  Type 2 diabetes mellitus with diabetic microalbuminuria Dissatisfied with endocrinologist. Current meds: Tresiba, Humalog,  Mounjaro. Mild nausea post-Mounjaro. Last A1c 9.3%. No labs in 2 years. Initiated lifestyle changes. Concerns about management and insurance issues. Willing to start anew with family practice. - Order comprehensive lab panel including A1c. - Schedule appointment with in-office pharmacist for diabetes management in two weeks. - Continue Tresiba 100 units daily, Humalog 17 units with dinner, Mounjaro 2.5 mg weekly. - Reassess management plan post-lab results.  Hypertension Medication management issues due to pharmacy errors. Current regimen: hydralazine, hydrochlorothiazide , losartan , clonidine. Plan to transition from clonidine to amlodipine. - Prescribe amlodipine to replace clonidine. - Continue hydralazine, hydrochlorothiazide , losartan . - Monitor blood pressure closely.  General Health Maintenance Due for routine screenings: mammogram and colon cancer. Prefers non-invasive colon cancer screening. - Provide referral for mammogram at [facility]. - Provide home stool test for colon cancer screening.  Follow-up Requires follow-up for diabetes management and routine health maintenance. - Schedule follow-up with pharmacist in two weeks. - Plan frequent follow-up visits for diabetes and hypertension  management.  Diagnoses and all orders for this visit:  Well adult exam  Depression screening (Z13.31) -     Depression Screen -(PHQ- 2/9, BDI)  Encounter for behavioral health screening (Z13.30) -     Anxiety Screen [[id number]]  Screening for cervical cancer -     PAP Test Liquid-Based Screening-Low Risk(Routine)  Type 2 diabetes mellitus without complication, without long-term current use of insulin (CMS/HHS-HCC) -     Comprehensive Metabolic Panel (CMP); Future -     Hemoglobin A1C; Future -     Ambulatory Referral to [facility]  Hypercholesterolemia -     Lipid Panel W/Reflex Direct Low Density Lipoprotein (LDL) Cholesterol; Future  Essential hypertension -     Comprehensive Metabolic Panel (CMP); Future -     hydrALAZINE (APRESOLINE) 50 MG tablet; Take 1 tablet (50 mg total) by mouth every 8 (eight) hours Take for SBP (Top number) more than 140 -     hydroCHLOROthiazide  (HYDRODIURIL ) 25 MG tablet; Take 1 tablet (25 mg total) by mouth once daily -     losartan  (COZAAR ) 100 MG tablet; Take 1 tablet (100 mg total) by mouth once daily -     amLODIPine (NORVASC) 5 MG tablet; Take 1 tablet (5 mg total) by mouth once daily -     Ambulatory Referral to [facility]  Screening for colon cancer -     Occult Blood CRC (Colorectal Cancer) Screening, Stool FIT; Future  Encounter for screening mammogram for breast cancer -     Mammo screening breast tomosynthesis bilateral; Future  Yeast vaginitis -     fluconazole  (DIFLUCAN ) 150 MG tablet; Take 1 tablet (150 mg total) by mouth once for 1 dose    RTC 2 weeks for Pharmacy visit   This visit was coded based on medical decision making (MDM).           Future Appointments     Date/Time Provider Department Center Visit Type   11/22/2023 10:30 AM (Arrive by 10:15 AM) Liane Sharyne Abbey, CPP [facility] [place] [facility] MEDICINE CHECK   02/07/2024 9:30 AM (Arrive by 9:15 AM) Perri Constance Sor, PA [facility]  [place] [facility] OFFICE VISIT       An after visit summary was provided for the patient either in written format or through MyChart.  This note has been created using automated tools and reviewed for accuracy by MYCHAL KELLY POWELL.

## 2024-01-03 NOTE — Progress Notes (Signed)
 PCP: Perri Constance Sor, PA   Patient ID   Kimberly Wilson is a 53 y.o. female who presents for a pharmacist visit for evaluation of  Type 2 diabetes per referral from Perri Constance Sor, [place].    Subjective   Diabetes Mellitus Type II  Unstable  Current meds:  Mounjaro 5 mg once weekly Tresiba 100 units once daily - PM Humalog 17 units with dinner   Previous medications: metformin (yeast infections), Xigduo (yeast infections), Jardiance (recurrent yeast infections), Trulicity (insurer did not cover), Ozempic (nausea and headache), Xultophy (not effective) and Januvia (not effective)  Taking medications as directed?  Yes  Medication side effects? Tolerating 5 mg weekly dose - no new concerns  Frequency of glucose monitoring: 3 times per day  Home blood glucose readings: fasting range: up and down - 130 this AM; , postprandial range: 8-9pm: sometimes mid to high 200s, and 2-3pm: low 200s  Recent hypoglycemia? no Hypoglycemia management plan in place? Yes - juice or candies  Aware of BG and A1C goals? yes  Hypertension  Unstable  Current medications:  HCTZ 25 mg once daily Losartan  100 mg once daily Hydralazine 50 mg three times daily Amlodipine 5 mg once daily  Previous medications: clonidine (replaced by amlodipine)  Taking medications as directed?  Yes  Medication side effects? No   Monitoring blood pressure at home? yes - but did not bring readings to appointment  Aware of BP goal? no  Were blood pressure medications taken today (or last night, if applicable)? Yes  Patient denies chest pain, shortness of breath, lightheadedness, and/or dizziness.  BP Readings from Last 3 Encounters:  01/03/24 132/82  11/22/23 127/84  11/08/23 (!) 141/79    Lifestyle Current diet:   Trying to get back to following the Atkins diet  Feels able to comply with diet:  Yes   She is trying to follow the Atkins diet and is trying to avoid eating past 7-8pm. A lot of  nights she does end up eating 30 minutes to an hour before bedtime due to daughter . We discussed switching her meals to have a heavier lunch and lighter supper if she cannot adjust the timing of the meal.  Are you following a low-salt diet? yes   Current exercise: walking - would like to get back to walking; daughter is getting a treadmill and she will use that - taking care of grandson who is 39.38 years old  Objective  BP 132/82   Pulse 105   Wt (!) 120.3 kg (265 lb 3.4 oz)   LMP 11/22/2015 (Exact Date)   BMI 50.11 kg/m            Most recent labs: HgbA1c:  Lab Results  Component Value Date   HGBA1C 10.9 (H) 11/08/2023    Lab Results  Component Value Date   CHOLTOTAL 235 (H) 11/08/2023   HDL 51 11/08/2023   LDLCALC 125 (H) 11/08/2023   TRIG 293 (H) 11/08/2023      Chemistry      Component Value Date/Time   NA 139 11/08/2023 1022   K 3.5 11/08/2023 1022   CL 103 11/08/2023 1022   CO2 24 11/08/2023 1022   BUN 6 (L) 11/08/2023 1022   CREATININE 0.7 11/08/2023 1022   GLUCOSE 257 (H) 11/08/2023 1022      Component Value Date/Time   CALCIUM 9.0 11/08/2023 1022   ALKPHOS 96 11/08/2023 1022   AST 37 11/08/2023 1022   ALT 38 11/08/2023 1022  TBILI 0.7 11/08/2023 1022      Lab Results  Component Value Date   GFR 103 11/08/2023   No results found for: ALBCREURRAT  No results found for: MICROCREAT  No results found for: MICROCREAT  The 10-year ASCVD risk score (Arnett DK, et al., 2019) is: 5.9%   Values used to calculate the score:     Age: 53 years     Clincally relevant sex: Female     Is Non-Hispanic African American: No     Diabetic: Yes     Tobacco smoker: No     Systolic Blood Pressure: 132 mmHg     Is BP treated: Yes     HDL Cholesterol: 51 mg/dL     Total Cholesterol: 235 mg/dL  Health Maintenance   Health Maintenance Due  Topic Date Due  . HIV Screen  Never done  . Hepatitis C Screen  Never done  . Mammogram  Never done  . Monofilament  Foot Exam  Never done  . Diabetes Eye Assessment Exam  Never done  . Pneumococcal Vaccine: 50+ (1 of 2 - PCV) Never done  . Shingrix (1 of 2) Never done  . Diabetes Education  09/14/2020  . Annual Urine Albumin Creatinine Ratio  03/08/2021  . Influenza Vaccine (1) 12/23/2023  . Colorectal Cancer Screening  01/01/2024    Hepatitis B     Name Date   Hep B 09/08/1998, 02/17/1998, 10/27/1997      No results found for: HBSAG  Statin: Unable to take due to side effects Aspirin: No  ACEI or ARB? Yes  Glucagon emergency kit: No Tobacco:  Social History   Tobacco Use  Smoking Status Never  Smokeless Tobacco Never    Assessment and Plan  Diabetes Mellitus:  Assessment:  poor control. Patient is currently tolerating Mounjaro 5 mg dose. CGM orders placed last admission but had issues with delivery from insurance company. She now has the supplies and will have daughter help set up today. Will need to connect with clinic on next visit.  FYI: She is also only injecting bolus insulin for one meal per day so if she does not tolerate a higher dose of Mounjaro, will plan to consider adding a second meal time injection.  Plan: -  Continue current medications - Patient will connect CGM and bring to clinic next appointment. -  Nutrition/General diabetes counseling: discussed low glycemic index carbohydrates versus high fiber carbohydrates versus low carbohydrate diet -  Encouraged regular aerobic exercise and weight loss -  BP goal of <130/80 -  LDL goal of <70  Prevention of ASCVD (primary)/Statin Use  Assessment:    The patient has the following risk factors: Diabetes Mellitus .    Last LDL above goal.     Patient is currently:  Allergic and/or Intolerant to a moderate  intensity statin; moderate -intensity statin indicated.    Plan: Continue current therapy  ACC/AHA guidelines advise in patients age 64-75 with diabetes mellitus at least moderate intensity statin is indicated  regardless of 10-year ASCVD risk estimate. It is reasonable to consider higher intensity statin to reduce LDL by at least 50% in patients with multiple ASCVD risk factors.  Hypertension Assessment: Blood pressure under suboptimal control but improved since last visit. Patient will resume home BP monitoring.  Plan: -  Continue current medication(s) - Can consider increasing amlodipine if BP control remains suboptimal -  Encouraged dietary sodium restriction/DASH diet -  Recommended regular aerobic exercise. -  Recommend home blood  pressure monitoring at least 3 times a week, to bring results in next visit -  Goal of BP <130/80  Education regarding condition and any medications prescribed provided in AVS given to patient at conclusion of today's office visit. Patient verbalized understanding and all questions were answered. Patient is agreeable with the plan outlined above. No barriers to learning were identified.  Continue yearly eye exams, Influenza Vaccine, Pneumonia Vaccine, Urine albumin/creatinine ratio, Lipid/Chem Panel.  Statin, Aspirin, and ACEI/ARB where appropriate.  Advise patient to never use tobacco products; Exercise 1 hour daily; Maintain normal BMI; Adhere to ADA diet; maintain regular foot inspections and glucose monitoring as directed.  Periodic Diabetes Education Classes  Time spent directly with patient: 35 minutes  Follow-up  Follow up 2 months with CPP and 1 month with PCP  Attestation Statement:   I personally performed the service, incident to.  (I2)   MYCHAL BURNARD MONTE, PA  Provider countersignature: CONSTANCE BURNARD MONTE, PA

## 2024-01-19 ENCOUNTER — Other Ambulatory Visit: Payer: Self-pay | Admitting: Internal Medicine

## 2024-03-02 ENCOUNTER — Other Ambulatory Visit: Payer: Self-pay | Admitting: Cardiology
